# Patient Record
Sex: Male | Born: 1965 | Race: White | Hispanic: No | Marital: Married | Smoking: Never smoker
Health system: Southern US, Community
[De-identification: ages and names within clinical notes are randomized; demographics above are authoritative.]

## PROBLEM LIST (undated history)

## (undated) DIAGNOSIS — E785 Hyperlipidemia, unspecified: Secondary | ICD-10-CM

## (undated) DIAGNOSIS — R7303 Prediabetes: Secondary | ICD-10-CM

## (undated) DIAGNOSIS — K219 Gastro-esophageal reflux disease without esophagitis: Secondary | ICD-10-CM

## (undated) DIAGNOSIS — G43909 Migraine, unspecified, not intractable, without status migrainosus: Secondary | ICD-10-CM

## (undated) DIAGNOSIS — G4730 Sleep apnea, unspecified: Secondary | ICD-10-CM

## (undated) HISTORY — PX: WISDOM TOOTH EXTRACTION: SHX21

## (undated) HISTORY — DX: Prediabetes: R73.03

## (undated) HISTORY — PX: ELBOW SURGERY: SHX618

## (undated) HISTORY — PX: HEMORROIDECTOMY: SUR656

## (undated) HISTORY — DX: Migraine, unspecified, not intractable, without status migrainosus: G43.909

## (undated) HISTORY — DX: Gastro-esophageal reflux disease without esophagitis: K21.9

## (undated) HISTORY — DX: Hyperlipidemia, unspecified: E78.5

## (undated) HISTORY — PX: KNEE ARTHROSCOPY: SUR90

## (undated) HISTORY — PX: OTHER SURGICAL HISTORY: SHX169

## (undated) HISTORY — DX: Sleep apnea, unspecified: G47.30

## (undated) HISTORY — PX: VASECTOMY: SHX75

## (undated) HISTORY — PX: ADENOIDECTOMY: SUR15

## (undated) HISTORY — PX: MOUTH SURGERY: SHX715

## (undated) HISTORY — PX: ULNAR NERVE TRANSPOSITION: SHX2595

---

## 2005-09-26 ENCOUNTER — Ambulatory Visit: Payer: Self-pay | Admitting: Family Medicine

## 2005-09-27 ENCOUNTER — Ambulatory Visit: Payer: Self-pay | Admitting: Family Medicine

## 2005-10-03 ENCOUNTER — Ambulatory Visit: Payer: Self-pay | Admitting: Internal Medicine

## 2005-12-20 ENCOUNTER — Ambulatory Visit: Payer: Self-pay | Admitting: Internal Medicine

## 2006-01-31 ENCOUNTER — Ambulatory Visit: Payer: Self-pay | Admitting: Internal Medicine

## 2008-02-04 ENCOUNTER — Ambulatory Visit: Payer: Self-pay | Admitting: Internal Medicine

## 2008-02-04 LAB — CONVERTED CEMR LAB
ALT: 41 units/L (ref 0–53)
AST: 33 units/L (ref 0–37)
Albumin: 4.5 g/dL (ref 3.5–5.2)
Alkaline Phosphatase: 48 units/L (ref 39–117)
BUN: 18 mg/dL (ref 6–23)
Basophils Absolute: 0 10*3/uL (ref 0.0–0.1)
Basophils Relative: 0.3 % (ref 0.0–3.0)
Bilirubin Urine: NEGATIVE
Bilirubin, Direct: 0.1 mg/dL (ref 0.0–0.3)
Blood in Urine, dipstick: NEGATIVE
CO2: 32 meq/L (ref 19–32)
Calcium: 9.7 mg/dL (ref 8.4–10.5)
Chloride: 106 meq/L (ref 96–112)
Cholesterol: 231 mg/dL (ref 0–200)
Creatinine, Ser: 1.1 mg/dL (ref 0.4–1.5)
Direct LDL: 124.5 mg/dL
Eosinophils Absolute: 0.1 10*3/uL (ref 0.0–0.7)
Eosinophils Relative: 1.4 % (ref 0.0–5.0)
GFR calc Af Amer: 95 mL/min
GFR calc non Af Amer: 78 mL/min
Glucose, Bld: 102 mg/dL — ABNORMAL HIGH (ref 70–99)
Glucose, Urine, Semiquant: NEGATIVE
HCT: 44.2 % (ref 39.0–52.0)
HDL: 44.7 mg/dL (ref >39.0)
Hemoglobin: 15.3 g/dL (ref 13.0–17.0)
Ketones, urine, test strip: NEGATIVE
Lymphocytes Relative: 28.9 % (ref 12.0–46.0)
MCHC: 34.7 g/dL (ref 30.0–36.0)
MCV: 85 fL (ref 78.0–100.0)
Monocytes Absolute: 0.5 10*3/uL (ref 0.1–1.0)
Monocytes Relative: 10.9 % (ref 3.0–12.0)
Neutro Abs: 2.6 10*3/uL (ref 1.4–7.7)
Neutrophils Relative %: 58.5 % (ref 43.0–77.0)
Nitrite: NEGATIVE
Platelets: 188 10*3/uL (ref 150–400)
Potassium: 4.2 meq/L (ref 3.5–5.1)
Protein, U semiquant: NEGATIVE
RBC: 5.2 M/uL (ref 4.22–5.81)
RDW: 11.5 % (ref 11.5–14.6)
Sodium: 143 meq/L (ref 135–145)
Specific Gravity, Urine: 1.02
TSH: 1.1 microintl units/mL (ref 0.35–5.50)
Total Bilirubin: 1.1 mg/dL (ref 0.3–1.2)
Total CHOL/HDL Ratio: 5.2
Total Protein: 7.5 g/dL (ref 6.0–8.3)
Triglycerides: 206 mg/dL (ref 0–149)
Urobilinogen, UA: 0.2
VLDL: 41 mg/dL — ABNORMAL HIGH (ref 0–40)
WBC Urine, dipstick: NEGATIVE
WBC: 4.5 10*3/uL (ref 4.5–10.5)
pH: 7

## 2008-02-13 ENCOUNTER — Ambulatory Visit: Payer: Self-pay | Admitting: Internal Medicine

## 2008-02-13 DIAGNOSIS — M549 Dorsalgia, unspecified: Secondary | ICD-10-CM | POA: Insufficient documentation

## 2008-02-21 ENCOUNTER — Encounter: Payer: Self-pay | Admitting: Internal Medicine

## 2008-02-27 ENCOUNTER — Encounter: Payer: Self-pay | Admitting: Internal Medicine

## 2008-03-04 ENCOUNTER — Encounter: Payer: Self-pay | Admitting: Internal Medicine

## 2008-06-23 ENCOUNTER — Ambulatory Visit: Payer: Self-pay | Admitting: Family Medicine

## 2008-08-07 ENCOUNTER — Ambulatory Visit: Payer: Self-pay | Admitting: Internal Medicine

## 2008-08-07 DIAGNOSIS — E785 Hyperlipidemia, unspecified: Secondary | ICD-10-CM | POA: Insufficient documentation

## 2008-08-11 LAB — CONVERTED CEMR LAB
Cholesterol: 206 mg/dL — ABNORMAL HIGH (ref 0–200)
Direct LDL: 132.7 mg/dL
HDL: 39.8 mg/dL (ref >39.00)
Total CHOL/HDL Ratio: 5
Triglycerides: 193 mg/dL — ABNORMAL HIGH (ref 0.0–149.0)
VLDL: 38.6 mg/dL (ref 0.0–40.0)

## 2008-08-18 ENCOUNTER — Telehealth: Payer: Self-pay | Admitting: Internal Medicine

## 2008-09-10 ENCOUNTER — Telehealth: Payer: Self-pay | Admitting: Internal Medicine

## 2008-10-13 ENCOUNTER — Ambulatory Visit: Payer: Self-pay | Admitting: Internal Medicine

## 2008-10-15 ENCOUNTER — Ambulatory Visit: Payer: Self-pay | Admitting: Sports Medicine

## 2008-10-15 DIAGNOSIS — M546 Pain in thoracic spine: Secondary | ICD-10-CM | POA: Insufficient documentation

## 2008-10-15 DIAGNOSIS — M629 Disorder of muscle, unspecified: Secondary | ICD-10-CM | POA: Insufficient documentation

## 2008-10-18 ENCOUNTER — Encounter: Payer: Self-pay | Admitting: Sports Medicine

## 2008-11-25 ENCOUNTER — Ambulatory Visit: Payer: Self-pay | Admitting: Sports Medicine

## 2008-12-30 ENCOUNTER — Ambulatory Visit: Payer: Self-pay | Admitting: Sports Medicine

## 2009-01-19 ENCOUNTER — Ambulatory Visit: Payer: Self-pay | Admitting: Sports Medicine

## 2009-02-17 ENCOUNTER — Ambulatory Visit: Payer: Self-pay | Admitting: Sports Medicine

## 2009-05-04 ENCOUNTER — Ambulatory Visit: Payer: Self-pay | Admitting: Sports Medicine

## 2009-05-04 DIAGNOSIS — M25569 Pain in unspecified knee: Secondary | ICD-10-CM | POA: Insufficient documentation

## 2009-07-02 ENCOUNTER — Ambulatory Visit: Payer: Self-pay | Admitting: Sports Medicine

## 2009-07-02 DIAGNOSIS — M25519 Pain in unspecified shoulder: Secondary | ICD-10-CM | POA: Insufficient documentation

## 2009-07-13 ENCOUNTER — Ambulatory Visit (HOSPITAL_COMMUNITY): Admission: RE | Admit: 2009-07-13 | Discharge: 2009-07-13 | Payer: Self-pay | Admitting: Sports Medicine

## 2009-08-21 ENCOUNTER — Encounter: Payer: Self-pay | Admitting: Internal Medicine

## 2009-10-01 ENCOUNTER — Encounter: Payer: Self-pay | Admitting: Internal Medicine

## 2010-01-05 ENCOUNTER — Ambulatory Visit: Payer: Self-pay | Admitting: Internal Medicine

## 2010-01-05 DIAGNOSIS — J069 Acute upper respiratory infection, unspecified: Secondary | ICD-10-CM | POA: Insufficient documentation

## 2010-02-25 ENCOUNTER — Encounter: Payer: Self-pay | Admitting: Internal Medicine

## 2010-03-24 ENCOUNTER — Encounter: Payer: Self-pay | Admitting: Internal Medicine

## 2010-03-24 ENCOUNTER — Ambulatory Visit
Admission: RE | Admit: 2010-03-24 | Discharge: 2010-03-24 | Payer: Self-pay | Source: Home / Self Care | Attending: Internal Medicine | Admitting: Internal Medicine

## 2010-03-24 DIAGNOSIS — R1031 Right lower quadrant pain: Secondary | ICD-10-CM

## 2010-03-24 DIAGNOSIS — R109 Unspecified abdominal pain: Secondary | ICD-10-CM | POA: Insufficient documentation

## 2010-03-24 NOTE — Progress Notes (Signed)
Subjective:     Patient ID: James Ballard is a 45 y.o. male.  HPIPt presents to clinic as a work in for evaluation of possible hernia. States one month h/o right groin pain that does not radiate. Has not noted bulge or mass. Exacerbated by certain positions or movement. Has longstanding right leg pain being evaluated by orthopedics. Not currently taking any medication for the problem. No specific injury or trauma that precipitated sx's.  The following portions of the patient's history were reviewed and updated as appropriate: allergies, current medications, past medical history and problem list.  Review of Systems  Genitourinary: Negative for flank pain, penile swelling and penile pain.  Musculoskeletal: Positive for myalgias and gait problem. Negative for back pain, joint swelling and arthralgias.       Objective:   Physical Exam  Constitutional: He appears well-developed and well-nourished. No distress.  HENT:  Head: Normocephalic and atraumatic.  Nose: Nose normal.  Eyes: Pupils are equal, round, and reactive to light.  Abdominal: Soft. Bowel sounds are normal. He exhibits no distension and no mass. There is no tenderness. There is no rebound and no guarding.  Genitourinary: Penis normal.       No inguinal hernia(direct or indirect) noted on exam (supine as well as standing with valsalva)  Musculoskeletal:       Mild tenderness to palp right groin without mass or adenopathy  Skin: He is not diaphoretic.       Assessment:     Groin pain      Plan:   No obvious hernia noted on exam. Consider possible MSK etiology. Recommend f/u with orthopedics.

## 2010-04-06 NOTE — Assessment & Plan Note (Signed)
Summary: James Ballard - KNEE AND BACK PAIN/MJD   Vital Signs:  Patient profile:   45 year old male BP sitting:   120 / 82  Vitals Entered By: James Ballard CMA (July 02, 2009 2:22 PM)  History of Present Illness: James Ballard persists in sharp RT knee pain This is not all typical of ITB in that his RT knee gets pain w tennis strokes; at rest; with leg crossed; sometimes in bed. not giving out or swelling but pain is limiting.  Upper back pain This has recurred as well he denies stress but then admits that: Son recently diagnosed with Noonan's syndrome Facing major cardiac surgery Family are jehovah's witnesses Not sure who to use and worried about issues of bleeding as they do not use blood being eval at [facility]  feels more back pain w fatigue and w work  recently l;eft shoulder pain thisw occxurred after holding daughter at funeral [place] w overhead activity  Allergies: 1)  ! Doxycycline Hyclate (Doxycycline Hyclate)  Physical Exam  General:  Well-developed,well-nourished,in no acute distress; alert,appropriate and cooperative throughout examination Msk:  knee exam shows no effusion; stable ligaments; negative Mcmurray's and provocative meniscal tests; non painful patellar compression; patellar and quadriceps tendons unremarkable. Localized area along lat joint line near ITB is very tender This was area of meniscus bulging on [place]  Upper back not able to identify any abnormality to exam today area of tenderness if med tip and 2 cms of RT scapula  LT shoulder Inspection reveals no abnormalities or assymetry; no atrophy noted; palpation is unremarkable;  ROM is full in all planes but painful on overhead testing. specific strength testing of Rotator cuff mm reveals good strength throughoutbut pain on suprs spin testing + signs of impingement on empty can and hawkins  speeds and yergason's tests normal;  no labral pathology noted; norm scapular function observed.  negative painful  arc and no drop arm sign.     Impression & Recommendations:  Problem # 1:  KNEE PAIN, RIGHT (ICD-719.46) with persistence oif sxs I am concerned that we may be missing something  I think we need o go ahead with MRI to check lat joint and post lat corner  Problem # 2:  BACK PAIN, THORACIC REGION, RIGHT (ICD-724.1) I am concerned about stress as a trigger  son w serious illness  he continues to work through this but notes wife is very stressed as well  Problem # 3:  SHOULDER PAIN, LEFT (ICD-719.41) classic for Supraspin tendonitis will strat on theraband exercises  hopefully see some improvement in 6 weeks or so  will give trial of diazepam 2.5 to 5mg  at night maybe with better sleep and mM relaxation will have better healing  Complete Medication List: 1)  Multivitamins Tabs (Multiple vitamin) .... Once daily 2)  Amitriptyline Hcl 25 Mg Tabs (Amitriptyline hcl) .... One by mouth q hs 3)  Lidoderm 5 % Ptch (Lidocaine) .... Use for 12 hours daily 4)  Diazepam 5 Mg Tabs (Diazepam) .... Take one half tab qhs  Patient Instructions: 1)  YOUR MRI IS SCHEDULED FOR TUES, MAY 3RD, 2010 AT NOON AT [facility]. [phone] Prescriptions: DIAZEPAM 5 MG TABS (DIAZEPAM) TAKE ONE HALF TAB QHS  #30 x 0   Entered by:   James Ballard CMA   Authorized by:   James Baas MD   Signed by:   James Ballard CMA on 07/02/2009   Method used:   Print then Give to Patient   RxID:  [id number] DIAZEPAM 5 MG TABS (DIAZEPAM) TAKE ONE HALF TAB QHS  #30 x 0   Entered by:   James Ballard CMA   Authorized by:   James Baas MD   Signed by:   James Ballard CMA on 07/02/2009   Method used:   Print then Give to Patient   RxID:   [id number]   Appended Document: Ryelan Ballard - KNEE AND BACK PAIN/MJD

## 2010-04-06 NOTE — Progress Notes (Signed)
Summary: MRI questions  Phone Note Call from Patient   Caller: Patient Call For: Birdie Sons MD Summary of Call: Pt is calling asking if you have seen the MRI from Dr. Turner Daniels? [phone] Initial call taken by: Lynann Beaver CMA,  August 18, 2008 2:14 PM  Follow-up for Phone Call        no Follow-up by: Birdie Sons MD,  August 18, 2008 4:38 PM  Additional Follow-up for Phone Call Additional follow up Details #1::        notified pt. Additional Follow-up by: Lynann Beaver CMA,  August 18, 2008 4:44 PM

## 2010-04-06 NOTE — Assessment & Plan Note (Signed)
Summary: FU BACK/MJD   Vital Signs:  Patient profile:   45 year old male BP sitting:   126 / 85  Vitals Entered By: Lillia Pauls CMA (January 19, 2009 11:40 AM)  History of Present Illness: Patient seen last month for 2 problmes:  ITB - this was injected and has steadily improved and is at least 50% better doing basic exercises and stretches  RT pericapular pain this did not respond to injection has not responded to multiple tx to date since last seen was evaluated at [facility] they did not have other ideas and suggested tring lyrica this caused severe headaches and he stopped this after a few days  also tried accupuncture with no help tx over past year was not helpful and since referral to me we have tried a variety of TX options with no real relief could not tolerate NTG  One thing that has helped is exercises have built strength back around scapula even if he has not had a drop in pain  Allergies: 1)  ! Doxycycline Hyclate (Doxycycline Hyclate)  Physical Exam  General:  Well-developed,well-nourished,in no acute distress; alert,appropriate and cooperative throughout examination Msk:  upper back shows area of sensitivity along RT medial scapula there is no palpable defect no redness or swelling good mm develoopment Additional Exam:  MSK [place] this is repeated today to see if we can identify any changes on firest visit we saw some thickening of border of scapula consistent with partial avulsion of tendonous jxn today the same area is still thickened on dynamic imaging with rotation of upper trunk areas of calcification within the periscapular MM appear but no tears or gaps are noted vascularity is mildly increased if at all  images saved   Impression & Recommendations:  Problem # 1:  BACK PAIN, THORACIC REGION, RIGHT (ICD-724.1) keep up exercise regimen  we will try more possible therapies to see if we can find any success  topical lidoderm - use partial patch 12  hours daily for next month cont the exercise regimen as noted but see if pain level decreases with additon of lidoderm don't t stop normal activity as this has not helped  reck 1 month if no response consider dry needling with saline and low dose lidocaine to area of calcification  Problem # 2:  ITBS, 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 Once daily 2)  Amitriptyline Hcl 25 Mg Tabs (Amitriptyline hcl) .... One by mouth q hs 3)  Lidoderm 5 % Ptch (Lidocaine) .... Use for 12 hours daily Prescriptions: LIDODERM 5 % PTCH (LIDOCAINE) use for 12 hours daily  #30 x 3   Entered by:   Lillia Pauls CMA   Authorized by:   Enid Baas MD   Signed by:   Lillia Pauls CMA on 01/19/2009   Method used:   Electronically to        [facility] [address] [phone]* (retail)       [address]       [place], [place]  [address]       Ph: [phone] or [phone]       Fax: [phone]   RxID:   [id number]

## 2010-04-06 NOTE — Assessment & Plan Note (Signed)
Summary: F/U BACK/KNEE,MC   Vital Signs:  Patient profile:   45 year old male Height:      74 inches Weight:      192 pounds BMI:     24.74 BP sitting:   137 / 97  Vitals Entered By: Lillia Pauls CMA (May 04, 2009 11:07 AM)  History of Present Illness: James Ballard was able to hit some tennis balls upper back did not hurt with this did not hurt with swimming still doing rehab exercises  RT ITB this still feels painful with activity has done lots of hip abduction and stretches however on hitting tennis ball the RT knee sometimes almost feels unstable no locking no giving out no swelling  Allergies: 1)  ! Doxycycline Hyclate (Doxycycline Hyclate)  Physical Exam  General:  Well-developed,well-nourished,in no acute distress; alert,appropriate and cooperative throughout examination Msk:  RT knee exam shows no effusion; stable ligaments; negative Mcmurray's and provocative meniscal tests; non painful patellar compression; patellar and quadriceps tendons unremarkable.  ITB is not tender lat joint line slightly tender hip abductors now very strong Additional Exam:  MSK [place] RT knee Scan of RT knee is done and shows: nl patellar tendon nl quad tendon no swelling suprapatellar pouch no effusion  medial meniscus normal ITB looks normal lat meniscus is displaced slightly laterally at area of pain and prolapses sligltly from joint line no tear is seen though and normal doppler actiivity  images saved   Impression & Recommendations:  Problem # 1:  KNEE PAIN, RIGHT (ICD-719.46)  I thought this was primarily ITB now suspect knee capsule was strained and meniscus displaced from joint line but not torn this should heal with support  try don joy support keep up cycling but try recumbent and limit knee flex to 30 deg  Orders: [place] LIMITED ([id number])  reck in 6 wks  Problem # 2:  ITBS, 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 Once daily 2)  Amitriptyline Hcl 25 Mg Tabs (Amitriptyline hcl) .... One by mouth q hs 3)  Lidoderm 5 % Ptch (Lidocaine) .... Use for 12 hours daily

## 2010-04-06 NOTE — Assessment & Plan Note (Signed)
Summary: 6 month/njr   Vital Signs:  Patient profile:   45 year old male Weight:      188 pounds Temp:     98.2 degrees F oral Pulse rate:   70 / minute Pulse rhythm:   regular Resp:     16 per minute BP sitting:   106 / 62  Vitals Entered By: Lynann Beaver CMA (August 07, 2008 9:53 AM) CC: rov Is Patient Diabetic? No Pain Assessment Patient in pain? no        CC:  rov.  History of Present Illness: back pain--has seen chiropractor, PT, ORTHO, MRI no results. exercising a lot  Current Medications (verified): 1)  Multivitamins  Tabs (Multiple Vitamin) .... Once Daily  Allergies (verified): 1)  ! Doxycycline Hyclate (Doxycycline Hyclate)  Past History:  Past Medical History: Unremarkable Hyperlipidemia  Review of Systems       All other systems reviewed and were negative   Physical Exam  General:  alert and well-developed.   Head:  normocephalic and atraumatic.   Eyes:  pupils equal and pupils round.   Ears:  R ear normal, L ear normal, and no external deformities.   Neck:  No deformities, masses, or tenderness noted. Chest Wall:  No deformities, masses, tenderness or gynecomastia noted. Lungs:  Normal respiratory effort, chest expands symmetrically. Lungs are clear to auscultation, no crackles or wheezes. Heart:  Normal rate and regular rhythm. S1 and S2 normal without gallop, murmur, click, rub or other extra sounds. Abdomen:  Bowel sounds positive,abdomen soft and non-tender without masses, organomegaly or hernias noted. Msk:  FROM neck and shoulders no pain to palpation of back   Impression & Recommendations:  Problem # 1:  BACK PAIN (ICD-724.5) unclear etiology he has had multiple evaluations and treatments recommended to stop all get MRI results and dr rowan's note may need sports med referral The following medications were removed from the medication list:    Mobic 15 Mg Tabs (Meloxicam) .Marland Kitchen... Take 1 tablet by mouth once a day for back pain   Problem # 2:  HYPERLIPIDEMIA (ICD-272.4)  needs f/u Orders: Venipuncture (16109) TLB-Lipid Panel (80061-LIPID)  Labs Reviewed: SGOT: 33 (02/04/2008)   SGPT: 41 (02/04/2008)   HDL:44.7 (02/04/2008)  LDL:DEL (02/04/2008)  Chol:231 (02/04/2008)  Trig:206 (02/04/2008)  Complete Medication List: 1)  Multivitamins Tabs (Multiple vitamin) .... Once daily  Patient Instructions: 1)  results from Dr. Turner Daniels (MRI)

## 2010-04-06 NOTE — Progress Notes (Signed)
Summary: Patient requesting review of MRI  Phone Note Call from Patient   Summary of Call: Patient calling to request that you review the results of his MRI dated 03/04/2008 from [facility]. Patient states you were going to look at these and decide what specialist to send him to. Patient can be reached at [phone]. Initial call taken by: Darra Lis RMA,  September 10, 2008 11:10 AM  Follow-up for Phone Call        see if we have sent request. i have not received the information Follow-up by: Birdie Sons MD,  September 10, 2008 3:18 PM  Additional Follow-up for Phone Call Additional follow up Details #1::        I'm sorry I put the wrong date. MRI results is in the chart dated 02/27/2008. MRI Thoracic spine and MRI left hip. Additional Follow-up by: Darra Lis RMA,  September 10, 2008 3:46 PM

## 2010-04-06 NOTE — Assessment & Plan Note (Signed)
Summary: congestion//ccm   Vital Signs:  Patient profile:   45 year old male Weight:      198 pounds BMI:     26.22 Temp:     98.5 degrees F oral Pulse rate:   94 / minute BP sitting:   116 / 78  (left arm) Cuff size:   large  Vitals Entered By: Alfred Levins, CMA (June 23, 2008 3:19 PM) CC: fever 102 last night   History of Present Illness: One week of stuffy head, PND, ST, fever, and chest congestion. Coughing up green sputum.   Allergies: 1)  ! Doxycycline Hyclate (Doxycycline Hyclate)  Past History:  Past Medical History:    Reviewed history from 02/13/2008 and no changes required:    Unremarkable  Review of Systems  The patient denies anorexia, weight loss, weight gain, vision loss, decreased hearing, hoarseness, chest pain, syncope, dyspnea on exertion, peripheral edema, hemoptysis, abdominal pain, melena, hematochezia, severe indigestion/heartburn, hematuria, incontinence, genital sores, muscle weakness, suspicious skin lesions, transient blindness, difficulty walking, depression, unusual weight change, abnormal bleeding, enlarged lymph nodes, angioedema, breast masses, and testicular masses.    Physical Exam  General:  Well-developed,well-nourished,in no acute distress; alert,appropriate and cooperative throughout examination Head:  Normocephalic and atraumatic without obvious abnormalities. No apparent alopecia or balding. Eyes:  No corneal or conjunctival inflammation noted. EOMI. Perrla. Funduscopic exam benign, without hemorrhages, exudates or papilledema. Vision grossly normal. Ears:  External ear exam shows no significant lesions or deformities.  Otoscopic examination reveals clear canals, tympanic membranes are intact bilaterally without bulging, retraction, inflammation or discharge. Hearing is grossly normal bilaterally. Nose:  External nasal examination shows no deformity or inflammation. Nasal mucosa are pink and moist without lesions or exudates. Mouth:   Oral mucosa and oropharynx without lesions or exudates.  Teeth in good repair. Neck:  No deformities, masses, or tenderness noted. Lungs:  scattered rhonchi   Impression & Recommendations:  Problem # 1:  ACUTE BRONCHITIS (ICD-466.0)  His updated medication list for this problem includes:    Augmentin 875-125 Mg Tabs (Amoxicillin-pot clavulanate) .Marland Kitchen..Marland Kitchen Two times a day  Complete Medication List: 1)  Multivitamins Tabs (Multiple vitamin) .... Once daily 2)  Mobic 15 Mg Tabs (Meloxicam) .... Take 1 tablet by mouth once a day for back pain 3)  Augmentin 875-125 Mg Tabs (Amoxicillin-pot clavulanate) .... Two times a day  Patient Instructions: 1)  Please schedule a follow-up appointment as needed .  Prescriptions: AUGMENTIN 875-125 MG TABS (AMOXICILLIN-POT CLAVULANATE) two times a day  #20 x 0   Entered and Authorized by:   Nelwyn Salisbury MD   Signed by:   Nelwyn Salisbury MD on 06/23/2008   Method used:   Electronically to        [facility] [address] [phone]* (retail)       [address]       [place], [place]  [address]       Ph: [phone] or [phone]       Fax: [phone]   RxID:   [id number]

## 2010-04-06 NOTE — Assessment & Plan Note (Signed)
Summary: FU BACK PAIN/MJD   Vital Signs:  Patient profile:   45 year old male BP sitting:   120 / 80  Vitals Entered By: Lillia Pauls CMA (February 17, 2009 8:41 AM)  History of Present Illness: Has played tennis twice sore in upper back but not worse than ITB RT knee ITB was pretty sore after playing goes to weight room feels back is stronger and hurts less  lots of strength gain in shoulders and upper back that he can notice  not doing enough stretches or strength work for hip abductors but is doing some EXT ROT of hip and some abduction exercises  Allergies: 1)  ! Doxycycline Hyclate (Doxycycline Hyclate)  Physical Exam  General:  Well-developed,well-nourished,in no acute distress; alert,appropriate and cooperative throughout examination Msk:  on repeat abduction and elevation he gets mild spasm at tip and medial border of RT scapula excellent strength excellent ROM slightly sensitive to direct pressure  ITB on rt is not tender but he is weak on abduction   Impression & Recommendations:  Problem # 1:  BACK PAIN, THORACIC REGION, RIGHT (ICD-724.1) this has improved keep up periscapular rehab program go ahead and play tennis  progress overhead and serve slowly as described to him  reck in 2 to 3 mos  Problem # 2:  ITBS, 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 Once daily 2)  Amitriptyline Hcl 25 Mg Tabs (Amitriptyline hcl) .... One by mouth q hs 3)  Lidoderm 5 % Ptch (Lidocaine) .... Use for 12 hours daily

## 2010-04-06 NOTE — Letter (Signed)
Summary: [facility]  [facility]   Imported By: Maryln Gottron 09/10/2009 13:20:23  _____________________________________________________________________  External Attachment:    Type:   Image     Comment:   External Document

## 2010-04-06 NOTE — Assessment & Plan Note (Signed)
Summary: FU 1 MONTH APPT/MJD   Vital Signs:  Patient profile:   45 year old male Height:      74 inches Weight:      190 pounds BP sitting:   126 / 84  Vitals Entered By: Lillia Pauls CMA (December 30, 2008 8:50 AM)  History of Present Illness: Pt presents for follow of right medial inferior scapular pain and right IT band syndrome. He was able to use the nitroglycerin patches at first and experienced the headaches. He tolerated it at first but then missed a few days and was unable to restart the patches despite cutting them into eigths because of headaches. Overall, he used the patches for 2-3 weeks. He has been doing the scapular stabilizing exercises with 3lb weights and overall feels stronger. However, if he twists, he can cause the pain to flare significantly. While he feels somewhat stronger, his symptoms have not changed.   Still having difficulty with lateral motions to the right with his right IT band syndrome. Has been doing exercises. Will have a good week followed by a week of pain if he irritates it.    Allergies: 1)  ! Doxycycline Hyclate (Doxycycline Hyclate)  Physical Exam  General:  alert, well-developed, and well-nourished.   Head:  normocephalic and atraumatic.   Lungs:  normal respiratory effort, no intercostal retractions, no accessory muscle use, and normal breath sounds.  Assessed after injection.  Msk:  Back: No scoliosis, bruising or edema. + TTP along the medial inferior border of his right scapula No TTP along cervical, thoracic or lumbar spine Full ROM with forward flexion, extension, leaning side to side and rotation bilaterally Can walk on heels and toes 5/5 strength with resisted knee flexion and extension 5/5 strength with resisted hip flexion and abduction Normal sensation throughout  Knee: RIGHT Normal to inspection with no erythema or effusion or obvious bony abnormalities. Palpation normal with no warmth or joint line tenderness or patellar  tenderness or condyle tenderness. + TTP of distal insertion of the right IT band ROM normal in flexion and extension and lower leg rotation. Ligaments with solid consistent endpoints including ACL, PCL, LCL, MCL. Negative Mcmurray's and provocative meniscal tests. Non painful patellar compression. Patellar and quadriceps tendons unremarkable. Hamstring and quadriceps strength is normal.  Normal FABER test  Knee: LEFT Normal to inspection with no erythema or effusion or obvious bony abnormalities. Palpation normal with no warmth or joint line tenderness or patellar tenderness or condyle tenderness. ROM normal in flexion and extension and lower leg rotation. Ligaments with solid consistent endpoints including ACL, PCL, LCL, MCL. Negative Mcmurray's and provocative meniscal tests. Non painful patellar compression. Patellar and quadriceps tendons unremarkable. Hamstring and quadriceps strength is normal.  Normal FABER testing     Impression & Recommendations:  Problem # 1:  BACK PAIN, THORACIC REGION, RIGHT (ICD-724.1) Assessment Unchanged  Pt consented to an injection of his right inferior medial scapula.  Consent obtained and verified. Sterile betadine prep. Further cleansed with alcohol. Topical analgesic spray: Ethyl chloride. Injection Site: Right inferior medial scapula Approached in typical fashion Completed without difficulty Meds: Kenalog 10mg , marcaine and lidocaine Needle: 25 gauge Aftercare instructions and Red flags advised. Lungs were CTAB with normal breath sounds  No exercises or stretche for the next 3 days. 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  Consent obtained and verified. Sterile betadine prep. Furthur cleansed with alcohol. Topical analgesic spray: Ethyl chloride. Joint: Right IT band Approached in typical fashion: Injected below the IT band 3 cm proximal to the insertion site. Completed without difficulty Meds: Kenalog 10mg , lidocaine and marcaine Needle: 25 gauge Aftercare instructions and Red flags advised.  No exercises or stretche for the next 3 days. Told that this may or may not decrease pain fully but will hopefully make him more functional Continue current exercises to stretch and stengthen IT band  Orders: Kenalog 10 mg inj (J8841) Joint Aspirate / Injection, Large (20610)  Complete Medication List: 1)  Multivitamins Tabs (Multiple vitamin) .... Once daily 2)  Amitriptyline Hcl 25 Mg Tabs (Amitriptyline hcl) .... One by mouth q hs 3)  Nitroglycerin 0.2 Mg/hr Pt24 (Nitroglycerin) .... Cut patch into quarters. place onto affected area as directed. change daily.

## 2010-04-06 NOTE — Letter (Signed)
Summary: [facility]   Imported By: Maryln Gottron 12/15/2009 12:48:29  _____________________________________________________________________  External Attachment:    Type:   Image     Comment:   External Document

## 2010-04-06 NOTE — Assessment & Plan Note (Signed)
Summary: COUGH, CONGESTION // RS   Vital Signs:  Patient profile:   45 year old male Weight:      200 pounds Temp:     98.2 degrees F oral BP sitting:   114 / 84  (left arm) Cuff size:   large  Vitals Entered By: Alfred Levins, CMA (January 05, 2010 3:23 PM) CC: cough, congestion x4 days   CC:  cough and congestion x4 days.  History of Present Illness: 45 year old patient, who presents with 4 day history of head and chest congestion, and mildly productive cough.  There is been no fever, chills, chest pain or shortness of breath.  His mother is recovering from pneumonia.  No prior history of pneumonia.  Sputum production is described as mildly discolored.  He has been using OTC medications with marginal benefit  Allergies: 1)  ! Doxycycline Hyclate (Doxycycline Hyclate)  Past History:  Past Medical History: Reviewed history from 08/07/2008 and no changes required. Unremarkable Hyperlipidemia  Review of Systems       The patient complains of hoarseness and prolonged cough.  The patient denies anorexia, fever, weight loss, weight gain, vision loss, decreased hearing, chest pain, syncope, dyspnea on exertion, peripheral edema, headaches, hemoptysis, abdominal pain, melena, severe indigestion/heartburn, hematuria, incontinence, genital sores, muscle weakness, suspicious skin lesions, transient blindness, difficulty walking, depression, unusual weight change, abnormal bleeding, enlarged lymph nodes, angioedema, breast masses, and testicular masses.    Physical Exam  General:  Well-developed,well-nourished,in no acute distress; alert,appropriate and cooperative throughout examination Head:  Normocephalic and atraumatic without obvious abnormalities. No apparent alopecia or balding. Eyes:  No corneal or conjunctival inflammation noted. EOMI. Perrla. Funduscopic exam benign, without hemorrhages, exudates or papilledema. Vision grossly normal. Ears:  External ear exam shows no  significant lesions or deformities.  Otoscopic examination reveals clear canals, tympanic membranes are intact bilaterally without bulging, retraction, inflammation or discharge. Hearing is grossly normal bilaterally. Nose:  External nasal examination shows no deformity or inflammation. Nasal mucosa are pink and moist without lesions or exudates. Mouth:  Oral mucosa and oropharynx without lesions or exudates.  Teeth in good repair. Neck:  No deformities, masses, or tenderness noted. Lungs:  Normal respiratory effort, chest expands symmetrically. Lungs are clear to auscultation, no crackles or wheezes. Heart:  Normal rate and regular rhythm. S1 and S2 normal without gallop, murmur, click, rub or other extra sounds.   Impression & Recommendations:  Problem # 1:  URI (ICD-465.9)  His updated medication list for this problem includes:    Hydrocodone-homatropine 5-1.5 Mg/52ml Syrp (Hydrocodone-homatropine) .Marland Kitchen... 1 teaspoon every 6 hours as needed for cough  Complete Medication List: 1)  Multivitamins Tabs (Multiple vitamin) .... Once daily 2)  Amitriptyline Hcl 25 Mg Tabs (Amitriptyline hcl) .... One by mouth q hs 3)  Lidoderm 5 % Ptch (Lidocaine) .... Use for 12 hours daily 4)  Diazepam 5 Mg Tabs (Diazepam) .... Take one half tab qhs 5)  Hydrocodone-homatropine 5-1.5 Mg/34ml Syrp (Hydrocodone-homatropine) .Marland Kitchen.. 1 teaspoon every 6 hours as needed for cough  Patient Instructions: 1)  Please schedule a follow-up appointment as needed. 2)  Get plenty of rest, drink lots of clear liquids, and use Tylenol or Ibuprofen for fever and comfort. Return in 7-10 days if you're not better:sooner if you're feeling worse. Prescriptions: HYDROCODONE-HOMATROPINE 5-1.5 MG/5ML SYRP (HYDROCODONE-HOMATROPINE) 1 teaspoon every 6 hours as needed for cough  #6 oz x 0   Entered and Authorized by:   Gordy Savers  MD   Signed by:  Gordy Savers  MD on 01/05/2010   Method used:   Print then Give to Patient    RxID:   [id number]    Orders Added: 1)  Est. Patient Level III [14782]

## 2010-04-06 NOTE — Letter (Signed)
Summary: [facility]   [facility]   Imported By: Maryln Gottron 08/20/2008 11:12:00  _____________________________________________________________________  External Attachment:    Type:   Image     Comment:   External Document

## 2010-04-06 NOTE — Assessment & Plan Note (Signed)
Summary: fu back/jw   Vital Signs:  Patient profile:   45 year old male BP sitting:   110 / 80  Vitals Entered By: Lillia Pauls CMA (November 25, 2008 11:38 AM)  History of Present Illness: Patient is a 45 yo male who presents for follow-up of right-sided back pain located near his inferolateral scapula as well as for right IT band syndrome.  He originally hurt his shoulder while doing a barbell pushup over a year ago. He still has difficulty with fatigue while trying to hold his 1 yo child. He has been doing the home scapular stabilizing exercise program daily. He initially started with 5lb weights but had to decrease to 3lb weights secondary to developing more left shoulder fatigue. He has not noticed much of a change in his symptoms. He has been able to start some push-up and is not doing push-ups on a chair. He tried the amitriptyline but had to decrease the dose to 1/4 of a pill or to about 6mg  at bedtime.   His right IT band started after taking a lateral step to the right to hit a forehand in tennis. At that time he felt a sharp pain in his lateral right knee. Denies swelling, locking or popping. Doing intermittent home exercises and stretches but not as faithfully as scapular exercises.   Allergies: 1)  ! Doxycycline Hyclate (Doxycycline Hyclate)  Physical Exam  General:  alert, well-developed, well-nourished, and well-hydrated.   Head:  Normocephalic and atraumatic. Msk:  Right Shoulder: Symmetric with left shoulder. No bony abnormalities, edema or bruising.  + TTP at inferomedial aspect of his scapula with point tenderness. No other tenderness noted. Has full ROM with forward flexion and abduction with mild pain. 5/5 strength Negative empty can, Neer's, Hawkin's and Speed's. Minimal pain with cross-over. Normal resisted internal ane external rotation.  Left Shoulder: No edema, bruising or bony abnormalities. Full ROM with forward flexion and abduction. No TTP  throughout. 5/5 strength Negative empty can, Neer's, Cross-over, Hawkin's and Speed's testing. Normal resisted internal and external rotation.  Knee: RIGHT Normal to inspection with no erythema or effusion or obvious bony abnormalities. Palpation normal with no warmth or joint line tenderness or patellar tenderness or condyle tenderness. + TTP at insertion of IT band Tight IT band appreciated ROM normal in flexion and extension and lower leg rotation. Ligaments with solid consistent endpoints including ACL, PCL, LCL, MCL. Negative Mcmurray's and provocative meniscal tests. Non painful patellar compression. Patellar and quadriceps tendons unremarkable. Hamstring and quadriceps strength is normal.   Knee: LEFT Normal to inspection with no erythema or effusion or obvious bony abnormalities. Palpation normal with no warmth or joint line tenderness or patellar tenderness or condyle tenderness. ROM normal in flexion and extension and lower leg rotation. Ligaments with solid consistent endpoints including ACL, PCL, LCL, MCL. Negative Mcmurray's and provocative meniscal tests. Non painful patellar compression. Patellar and quadriceps tendons unremarkable. Hamstring and quadriceps strength is normal.   Hip abductor strength is decreased bilaterally. Weak hip flexors as well.    Impression & Recommendations:  Problem # 1:  BACK PAIN, THORACIC REGION, RIGHT (ICD-724.1) Assessment Unchanged 1. Continue home exercise program but decrease weights down to 1lb if needed to avoid left shoulder fatigue. 2. Given an Rx for nitroglycerin patch, 0.2mg , to wear daily. Explained that we have little evidence to support this but that this may increase blood flow to the area stimulating healing. Warned of possible headaches for 3-5 days as a side effect. Directed  to cut patches in quarters and to place a new quarter patch to his back in the painful region daily.  3. Retun in 4 weeks for  follow-up.  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 Given IT band stretches and exercises to do daily. 2. Ice massage daily for 5 minutes. 3. OTC NSAID as needed. 4. Activity modification.   Complete Medication List: 1)  Multivitamins Tabs (Multiple vitamin) .... Once daily 2)  Amitriptyline Hcl 25 Mg Tabs (Amitriptyline hcl) .... One by mouth q hs 3)  Nitroglycerin 0.2 Mg/hr Pt24 (Nitroglycerin) .... Cut patch into quarters. place onto affected area as directed. change daily. Prescriptions: NITROGLYCERIN 0.2 MG/HR PT24 (NITROGLYCERIN) Cut patch into quarters. Place onto affected area as directed. Change daily.  #1 box x 2   Entered by:   Jannifer Rodney MD   Authorized by:   Enid Baas MD   Signed by:   Jannifer Rodney MD on 11/25/2008   Method used:   Electronically to        [facility] [address] [phone]* (retail)       [address]       [place], [place]  [address]       Ph: [phone] or [phone]       Fax: [phone]   RxID:   [id number]

## 2010-04-08 NOTE — Assessment & Plan Note (Signed)
Summary: ? HERNIA//CCM   Vital Signs:  Patient profile:   45 year old male Weight:      198 pounds Pulse rate:   70 / minute BP sitting:   120 / 80  (left arm)  Vitals Entered By: Kyung Rudd, CMA (March 24, 2010 10:50 AM) CC: pt c/o possible hernia x 1 1/2 months   CC:  pt c/o possible hernia x 1 1/2 months.  History of Present Illness: Patient presents to clinic as a workin for evaluation of groin pain. Notes one month h/o right groin pain without radiation, injury or trauma. Currently under care of orthopedic chiropractor but wondered if could be hernia. Denies any palpable mass or bulge. Pain increases with activity or position change. Taking no medication for this.   Current Medications (verified): 1)  None  Allergies (verified): 1)  ! Doxycycline Hyclate (Doxycycline Hyclate)  Past History:  Past Medical History: Last updated: 08/07/2008 Unremarkable Hyperlipidemia  Past Surgical History: Last updated: 02/13/2008 ulnar nerve tranpiration left knee arthroscopy incisor cyst excision sesmoid fx-excised PMH-FH-SH reviewed-no changes except otherwise noted  Review of Systems      See HPI GI:  Denies abdominal pain. MS:  Complains of joint pain, mid back pain, and muscle aches; denies joint redness, joint swelling, low back pain, and stiffness.  Physical Exam  General:  Well-developed,well-nourished,in no acute distress; alert,appropriate and cooperative throughout examination Head:  Normocephalic and atraumatic without obvious abnormalities. No apparent alopecia or balding. Eyes:  pupils equal, pupils round, and corneas and lenses clear.   Ears:  no external deformities.   Nose:  no external deformity.   Abdomen:  Bowel sounds positive,abdomen soft and non-tender without masses, organomegaly or hernias noted. Genitalia:  No obvious direct or indirect inguinal hernia noted. Examined supine, standing with and without valsalva maneuvers. Neurologic:  alert &  oriented X3 and gait normal.     Impression & Recommendations:  Problem # 1:  INGUINAL PAIN, RIGHT (ICD-789.09) Assessment New No obvious hernia noted. Recommend f/u with orthopedics for further evaluation.   Orders Added: 1)  Est. Patient Level II [30865]

## 2010-04-08 NOTE — Miscellaneous (Signed)
Summary: Immunization Entry   Immunization History:  Influenza Immunization History:    Influenza:  historical (02/23/2010)

## 2010-12-09 ENCOUNTER — Other Ambulatory Visit (INDEPENDENT_AMBULATORY_CARE_PROVIDER_SITE_OTHER): Payer: BC Managed Care – PPO

## 2010-12-09 DIAGNOSIS — Z0000 Encounter for general adult medical examination without abnormal findings: Secondary | ICD-10-CM

## 2010-12-09 LAB — CBC WITH DIFFERENTIAL/PLATELET
Basophils Absolute: 0 10*3/uL (ref 0.0–0.1)
Basophils Relative: 0.4 % (ref 0.0–3.0)
Eosinophils Absolute: 0.1 10*3/uL (ref 0.0–0.7)
Eosinophils Relative: 1.3 % (ref 0.0–5.0)
HCT: 44.8 % (ref 39.0–52.0)
Hemoglobin: 15.1 g/dL (ref 13.0–17.0)
Lymphocytes Relative: 28.5 % (ref 12.0–46.0)
Lymphs Abs: 1.4 10*3/uL (ref 0.7–4.0)
MCHC: 33.7 g/dL (ref 30.0–36.0)
MCV: 85.5 fl (ref 78.0–100.0)
Monocytes Absolute: 0.5 10*3/uL (ref 0.1–1.0)
Monocytes Relative: 9.8 % (ref 3.0–12.0)
Neutro Abs: 3 10*3/uL (ref 1.4–7.7)
Neutrophils Relative %: 60 % (ref 43.0–77.0)
Platelets: 226 10*3/uL (ref 150.0–400.0)
RBC: 5.24 Mil/uL (ref 4.22–5.81)
RDW: 12.5 % (ref 11.5–14.6)
WBC: 4.9 10*3/uL (ref 4.5–10.5)

## 2010-12-09 LAB — POCT URINALYSIS DIPSTICK
Bilirubin, UA: NEGATIVE
Blood, UA: NEGATIVE
Glucose, UA: NEGATIVE
Ketones, UA: NEGATIVE
Leukocytes, UA: NEGATIVE
Nitrite, UA: NEGATIVE
Protein, UA: NEGATIVE
Spec Grav, UA: 1.015
Urobilinogen, UA: 0.2
pH, UA: 6

## 2010-12-09 LAB — BASIC METABOLIC PANEL
BUN: 15 mg/dL (ref 6–23)
CO2: 27 mEq/L (ref 19–32)
Calcium: 9.3 mg/dL (ref 8.4–10.5)
Chloride: 106 mEq/L (ref 96–112)
Creatinine, Ser: 1 mg/dL (ref 0.4–1.5)
GFR: 88.23 mL/min (ref >60.00)
Glucose, Bld: 89 mg/dL (ref 70–99)
Potassium: 4.1 mEq/L (ref 3.5–5.1)
Sodium: 140 mEq/L (ref 135–145)

## 2010-12-09 LAB — HEPATIC FUNCTION PANEL
ALT: 27 U/L (ref 0–53)
AST: 25 U/L (ref 0–37)
Albumin: 4.6 g/dL (ref 3.5–5.2)
Alkaline Phosphatase: 54 U/L (ref 39–117)
Bilirubin, Direct: 0.1 mg/dL (ref 0.0–0.3)
Total Bilirubin: 1 mg/dL (ref 0.3–1.2)
Total Protein: 7.4 g/dL (ref 6.0–8.3)

## 2010-12-09 LAB — LIPID PANEL
Cholesterol: 236 mg/dL — ABNORMAL HIGH (ref 0–200)
HDL: 49.4 mg/dL (ref >39.00)
Total CHOL/HDL Ratio: 5
Triglycerides: 179 mg/dL — ABNORMAL HIGH (ref 0.0–149.0)
VLDL: 35.8 mg/dL (ref 0.0–40.0)

## 2010-12-09 LAB — LDL CHOLESTEROL, DIRECT: Direct LDL: 153.1 mg/dL

## 2010-12-09 LAB — TSH: TSH: 1.35 u[IU]/mL (ref 0.35–5.50)

## 2010-12-15 ENCOUNTER — Encounter: Payer: Self-pay | Admitting: Internal Medicine

## 2010-12-16 ENCOUNTER — Ambulatory Visit (INDEPENDENT_AMBULATORY_CARE_PROVIDER_SITE_OTHER): Payer: BC Managed Care – PPO | Admitting: Internal Medicine

## 2010-12-16 ENCOUNTER — Encounter: Payer: Self-pay | Admitting: Internal Medicine

## 2010-12-16 VITALS — BP 130/90 | HR 68 | Temp 98.3°F | Ht 73.5 in | Wt 198.0 lb

## 2010-12-16 DIAGNOSIS — Z0000 Encounter for general adult medical examination without abnormal findings: Secondary | ICD-10-CM

## 2010-12-16 DIAGNOSIS — Z23 Encounter for immunization: Secondary | ICD-10-CM

## 2010-12-16 NOTE — Progress Notes (Signed)
  Subjective:    Patient ID: James Ballard, male    DOB: Jun 08, 1965, 45 y.o.   MRN: [id number]  HPI  cpx  Continues to have multiple aches and pains, but he is back to playing tennis.   Past Medical History  Diagnosis Date  . Hyperlipidemia    Past Surgical History  Procedure Date  . Ulnar nerve transposition   . Knee arthroscopy     left  . Sesmoid fx- excised     reports that he has never smoked. He does not have any smokeless tobacco history on file. His alcohol and drug histories not on file. family history includes Heart disease in his father; Hyperlipidemia in his brother; and Migraines in his brother and sister. Allergies  Allergen Reactions  . Doxycycline Hyclate     REACTION: rash     Review of Systems  patient denies chest pain, shortness of breath, orthopnea. Denies lower extremity edema, abdominal pain, change in appetite, change in bowel movements. Patient denies rashes, musculoskeletal complaints. No other specific complaints in a complete review of systems.      Objective:   Physical Exam Well-developed male in no acute distress. HEENT exam atraumatic, normocephalic, extraocular muscles are intact. Conjunctivae are pink without exudate. Neck is supple without lymphadenopathy, thyromegaly, jugular venous distention. Chest is clear to auscultation without increased work of breathing. Cardiac exam S1-S2 are regular. The PMI is normal. No significant murmurs or gallops. Abdominal exam active bowel sounds, soft, nontender. No abdominal bruits. Extremities no clubbing cyanosis or edema. Peripheral pulses are normal without bruits. Neurologic exam alert and oriented without any motor or sensory deficits.     Assessment & Plan:  Well visit---health maint UTD

## 2011-04-20 ENCOUNTER — Encounter: Payer: Self-pay | Admitting: Family

## 2011-04-20 ENCOUNTER — Ambulatory Visit (INDEPENDENT_AMBULATORY_CARE_PROVIDER_SITE_OTHER): Payer: BC Managed Care – PPO | Admitting: Family

## 2011-04-20 DIAGNOSIS — J069 Acute upper respiratory infection, unspecified: Secondary | ICD-10-CM

## 2011-04-20 DIAGNOSIS — R059 Cough, unspecified: Secondary | ICD-10-CM

## 2011-04-20 DIAGNOSIS — R05 Cough: Secondary | ICD-10-CM

## 2011-04-20 MED ORDER — AMOXICILLIN-POT CLAVULANATE 875-125 MG PO TABS: 1.0000 | ORAL_TABLET | Freq: Two times a day (BID) | ORAL | Status: AC

## 2011-04-20 NOTE — Progress Notes (Signed)
  Subjective:    Patient ID: James Ballard, male    DOB: 10-04-1965, 46 y.o.   MRN: [id number]  HPI 46 year old white male, nonsmoker, patient of Dr. Cato Mulligan is in today with complaints of fever, nasal congestion, chest congestion and Tylenol for 5 days and worsening. He is now developed pain in his feet since he. Cough is productive of green sputum. Hasn't taken over-the-counter DayQuil and myoglobin He denies any lightheadedness, dizziness, chest pain, palpitations, shortness of breath or edema.   Review of Systems  Constitutional: Positive for fever and fatigue.  HENT: Positive for congestion, sneezing, postnasal drip and sinus pressure.   Eyes: Negative.   Respiratory: Positive for cough.   Cardiovascular: Negative.   Gastrointestinal: Negative.   Musculoskeletal: Negative.   Skin: Negative.   Neurological: Negative.   Hematological: Negative.   Psychiatric/Behavioral: Negative.    Past Medical History  Diagnosis Date  . Hyperlipidemia     History   Social History  . Marital Status: Married    Spouse Name: N/A    Number of Children: N/A  . Years of Education: N/A   Occupational History  . Not on file.   Social History Main Topics  . Smoking status: Never Smoker   . Smokeless tobacco: Not on file  . Alcohol Use:   . Drug Use:   . Sexually Active:    Other Topics Concern  . Not on file   Social History Narrative  . No narrative on file    Past Surgical History  Procedure Date  . Ulnar nerve transposition   . Knee arthroscopy     left  . Sesmoid fx- excised     Family History  Problem Relation Age of Onset  . Heart disease Father     pacemaker--has been removed-may have never needed it  . Migraines Sister   . Hyperlipidemia Brother   . Migraines Brother     Allergies  Allergen Reactions  . Doxycycline Hyclate     REACTION: rash    No current outpatient prescriptions on file prior to visit.    BP 120/94  Temp(Src) 98.6 F (37 C) (Oral)  Wt  200 lb (90.719 kg)chart   Objective:   Physical Exam  Constitutional: He is oriented to person, place, and time.  Cardiovascular: Normal rate and regular rhythm.   Pulmonary/Chest: Effort normal and breath sounds normal.  Musculoskeletal: Normal range of motion.  Neurological: He is alert and oriented to person, place, and time.  Skin: Skin is warm and dry.  Psychiatric: He has a normal mood and affect.           Assessment & Plan:  Assessment: Upper respiratory infection likely bacterial. Cough  Plan: Augmentin 875 one by mouth twice a day x10 days. Mucinex DM over-the-counter as directed. Rest. Drink plenty of fluids. Call the office if symptoms worsen or persist, recheck as scheduled and when necessary.

## 2011-04-20 NOTE — Patient Instructions (Signed)
1. Mucinex DM as directed.   Upper Respiratory Infection, Adult An upper respiratory infection (URI) is also sometimes known as the common cold. The upper respiratory tract includes the nose, sinuses, throat, trachea, and bronchi. Bronchi are the airways leading to the lungs. Most people improve within 1 week, but symptoms can last up to 2 weeks. A residual cough may last even longer.  CAUSES Many different viruses can infect the tissues lining the upper respiratory tract. The tissues become irritated and inflamed and often become very moist. Mucus production is also common. A cold is contagious. You can easily spread the virus to others by oral contact. This includes kissing, sharing a glass, coughing, or sneezing. Touching your mouth or nose and then touching a surface, which is then touched by another person, can also spread the virus. SYMPTOMS  Symptoms typically develop 1 to 3 days after you come in contact with a cold virus. Symptoms vary from person to person. They may include:  Runny nose.   Sneezing.   Nasal congestion.   Sinus irritation.   Sore throat.   Loss of voice (laryngitis).   Cough.   Fatigue.   Muscle aches.   Loss of appetite.   Headache.   Low-grade fever.  DIAGNOSIS  You might diagnose your own cold based on familiar symptoms, since most people get a cold 2 to 3 times a year. Your caregiver can confirm this based on your exam. Most importantly, your caregiver can check that your symptoms are not due to another disease such as strep throat, sinusitis, pneumonia, asthma, or epiglottitis. Blood tests, throat tests, and X-rays are not necessary to diagnose a common cold, but they may sometimes be helpful in excluding other more serious diseases. Your caregiver will decide if any further tests are required. RISKS AND COMPLICATIONS  You may be at risk for a more severe case of the common cold if you smoke cigarettes, have chronic heart disease (such as heart  failure) or lung disease (such as asthma), or if you have a weakened immune system. The very young and very old are also at risk for more serious infections. Bacterial sinusitis, middle ear infections, and bacterial pneumonia can complicate the common cold. The common cold can worsen asthma and chronic obstructive pulmonary disease (COPD). Sometimes, these complications can require emergency medical care and may be life-threatening. PREVENTION  The best way to protect against getting a cold is to practice good hygiene. Avoid oral or hand contact with people with cold symptoms. Wash your hands often if contact occurs. There is no clear evidence that vitamin C, vitamin E, echinacea, or exercise reduces the chance of developing a cold. However, it is always recommended to get plenty of rest and practice good nutrition. TREATMENT  Treatment is directed at relieving symptoms. There is no cure. Antibiotics are not effective, because the infection is caused by a virus, not by bacteria. Treatment may include:  Increased fluid intake. Sports drinks offer valuable electrolytes, sugars, and fluids.   Breathing heated mist or steam (vaporizer or shower).   Eating chicken soup or other clear broths, and maintaining good nutrition.   Getting plenty of rest.   Using gargles or lozenges for comfort.   Controlling fevers with ibuprofen or acetaminophen as directed by your caregiver.   Increasing usage of your inhaler if you have asthma.  Zinc gel and zinc lozenges, taken in the first 24 hours of the common cold, can shorten the duration and lessen the severity of symptoms.  Pain medicines may help with fever, muscle aches, and throat pain. A variety of non-prescription medicines are available to treat congestion and runny nose. Your caregiver can make recommendations and may suggest nasal or lung inhalers for other symptoms.  HOME CARE INSTRUCTIONS   Only take over-the-counter or prescription medicines for pain,  discomfort, or fever as directed by your caregiver.   Use a warm mist humidifier or inhale steam from a shower to increase air moisture. This may keep secretions moist and make it easier to breathe.   Drink enough water and fluids to keep your urine clear or pale yellow.   Rest as needed.   Return to work when your temperature has returned to normal or as your caregiver advises. You may need to stay home longer to avoid infecting others. You can also use a face mask and careful hand washing to prevent spread of the virus.  SEEK MEDICAL CARE IF:   After the first few days, you feel you are getting worse rather than better.   You need your caregiver's advice about medicines to control symptoms.   You develop chills, worsening shortness of breath, or brown or red sputum. These may be signs of pneumonia.   You develop yellow or brown nasal discharge or pain in the face, especially when you bend forward. These may be signs of sinusitis.   You develop a fever, swollen neck glands, pain with swallowing, or white areas in the back of your throat. These may be signs of strep throat.  SEEK IMMEDIATE MEDICAL CARE IF:   You have a fever.   You develop severe or persistent headache, ear pain, sinus pain, or chest pain.   You develop wheezing, a prolonged cough, cough up blood, or have a change in your usual mucus (if you have chronic lung disease).   You develop sore muscles or a stiff neck.  Document Released: 08/17/2000 Document Revised: 11/03/2010 Document Reviewed: 06/25/2010 [facility] Patient Information 2012 [place], [place].

## 2011-04-25 ENCOUNTER — Ambulatory Visit (INDEPENDENT_AMBULATORY_CARE_PROVIDER_SITE_OTHER): Payer: BC Managed Care – PPO | Admitting: Family

## 2011-04-25 ENCOUNTER — Encounter: Payer: Self-pay | Admitting: Family

## 2011-04-25 ENCOUNTER — Other Ambulatory Visit: Payer: Self-pay | Admitting: Family

## 2011-04-25 ENCOUNTER — Ambulatory Visit (INDEPENDENT_AMBULATORY_CARE_PROVIDER_SITE_OTHER)
Admission: RE | Admit: 2011-04-25 | Discharge: 2011-04-25 | Disposition: A | Discharge status: Home / Self Care | Payer: BC Managed Care – PPO | Source: Ambulatory Visit | Attending: Family | Admitting: Family

## 2011-04-25 DIAGNOSIS — R059 Cough, unspecified: Secondary | ICD-10-CM

## 2011-04-25 DIAGNOSIS — R05 Cough: Secondary | ICD-10-CM

## 2011-04-25 DIAGNOSIS — R062 Wheezing: Secondary | ICD-10-CM

## 2011-04-25 MED ORDER — PREDNISONE 20 MG PO TABS: ORAL_TABLET | ORAL | Status: AC

## 2011-04-25 MED ORDER — MOXIFLOXACIN HCL 400 MG PO TABS: 400.0000 mg | ORAL_TABLET | Freq: Every day | ORAL | Status: AC

## 2011-04-25 NOTE — Patient Instructions (Signed)

## 2011-04-25 NOTE — Progress Notes (Signed)
  Subjective:    Patient ID: James Ballard, male    DOB: December 18, 1965, 46 y.o.   MRN: [id number]  HPI Comments: C/o productive cough with bloody-brown secretions, sorethroat, sinus pressure, and intermittent headaches getting progressively worse since Wed. Was seen in clinic Wed and prescribed amoxicillin, has taken 1/2 the tablets and OTC mucinex. Denies dyspnea, fever, chills, nausea, or diarrhea.   Cough Associated symptoms include postnasal drip, a sore throat and wheezing. Pertinent negatives include no ear pain, rhinorrhea or shortness of breath.  URI  Associated symptoms include congestion, coughing, a sore throat and wheezing. Pertinent negatives include no ear pain, rhinorrhea or sneezing.      Review of Systems  Constitutional: Negative.   HENT: Positive for congestion, sore throat, postnasal drip and sinus pressure. Negative for ear pain, nosebleeds, facial swelling, rhinorrhea, sneezing and ear discharge.   Eyes: Negative.   Respiratory: Positive for cough and wheezing. Negative for apnea, chest tightness, shortness of breath and stridor.    Past Medical History  Diagnosis Date  . Hyperlipidemia     History   Social History  . Marital Status: Married    Spouse Name: N/A    Number of Children: N/A  . Years of Education: N/A   Occupational History  . Not on file.   Social History Main Topics  . Smoking status: Never Smoker   . Smokeless tobacco: Not on file  . Alcohol Use:   . Drug Use:   . Sexually Active:    Other Topics Concern  . Not on file   Social History Narrative  . No narrative on file    Past Surgical History  Procedure Date  . Ulnar nerve transposition   . Knee arthroscopy     left  . Sesmoid fx- excised     Family History  Problem Relation Age of Onset  . Heart disease Father     pacemaker--has been removed-may have never needed it  . Migraines Sister   . Hyperlipidemia Brother   . Migraines Brother     Allergies  Allergen  Reactions  . Doxycycline Hyclate     REACTION: rash    Current Outpatient Prescriptions on File Prior to Visit  Medication Sig Dispense Refill  . amoxicillin-clavulanate (AUGMENTIN) 875-125 MG per tablet Take 1 tablet by mouth 2 (two) times daily.  20 tablet  0    BP 120/80  Pulse 86  Temp(Src) 98.3 F (36.8 C) (Oral)  SpO2 97%chart     Objective:   Physical Exam  Constitutional: He is oriented to person, place, and time. He appears well-developed and well-nourished. No distress.  HENT:  Right Ear: External ear normal.  Left Ear: External ear normal.  Nose: Nose normal.  Mouth/Throat: Oropharynx is clear and moist.  Cardiovascular: Normal rate, regular rhythm and normal heart sounds.  Exam reveals no gallop and no friction rub.   No murmur heard. Pulmonary/Chest: Effort normal. No respiratory distress. He has wheezes. He has no rales. He exhibits no tenderness.  Neurological: He is alert and oriented to person, place, and time.  Skin: Skin is warm and dry. He is not diaphoretic.          Assessment & Plan:  Assessment: URI  Plan: Complete amoxicillin, albuterol inhaler, prednisone. Chest xray. Teaching handouts provided on URI and opportunity for questions provided. Encouraged to cough and deep breathe at home and increase po fluids. RTC if s/s get worse or do not subside

## 2011-05-02 ENCOUNTER — Encounter: Payer: Self-pay | Admitting: Family

## 2011-05-02 ENCOUNTER — Ambulatory Visit (INDEPENDENT_AMBULATORY_CARE_PROVIDER_SITE_OTHER): Payer: BC Managed Care – PPO | Admitting: Family

## 2011-05-02 ENCOUNTER — Ambulatory Visit (INDEPENDENT_AMBULATORY_CARE_PROVIDER_SITE_OTHER): Payer: BC Managed Care – PPO | Admitting: Internal Medicine

## 2011-05-02 ENCOUNTER — Ambulatory Visit (INDEPENDENT_AMBULATORY_CARE_PROVIDER_SITE_OTHER)
Admission: RE | Admit: 2011-05-02 | Discharge: 2011-05-02 | Disposition: A | Discharge status: Home / Self Care | Payer: BC Managed Care – PPO | Source: Ambulatory Visit | Attending: Internal Medicine | Admitting: Internal Medicine

## 2011-05-02 ENCOUNTER — Telehealth: Payer: Self-pay | Admitting: *Deleted

## 2011-05-02 ENCOUNTER — Encounter: Payer: Self-pay | Admitting: Internal Medicine

## 2011-05-02 VITALS — BP 126/82 | HR 109 | Temp 98.1°F | Ht 73.0 in | Wt 203.4 lb

## 2011-05-02 DIAGNOSIS — I2699 Other pulmonary embolism without acute cor pulmonale: Secondary | ICD-10-CM

## 2011-05-02 DIAGNOSIS — R059 Cough, unspecified: Secondary | ICD-10-CM

## 2011-05-02 DIAGNOSIS — J189 Pneumonia, unspecified organism: Secondary | ICD-10-CM

## 2011-05-02 DIAGNOSIS — R05 Cough: Secondary | ICD-10-CM

## 2011-05-02 DIAGNOSIS — M549 Dorsalgia, unspecified: Secondary | ICD-10-CM | POA: Insufficient documentation

## 2011-05-02 MED ORDER — IOHEXOL 300 MG/ML  SOLN
80.0000 mL | Freq: Once | INTRAMUSCULAR | Status: AC | PRN
  Administered 2011-05-02: 80 mL via INTRAVENOUS

## 2011-05-02 MED ORDER — HYDROCOD POLST-CHLORPHEN POLST 10-8 MG/5ML PO LQCR: 5.0000 mL | Freq: Two times a day (BID) | ORAL | Status: DC

## 2011-05-02 MED ORDER — BENZONATATE 100 MG PO CAPS: 100.0000 mg | ORAL_CAPSULE | Freq: Three times a day (TID) | ORAL | Status: AC | PRN

## 2011-05-02 NOTE — Patient Instructions (Addendum)
Please have CT scan chest with contrast PE protocol for cxr abnormality, cough, and recent knee surgery  - will call you with results Please start netti pot daily - use with bottled water Please start OTC zegerid 1 tablet daily in morning on empty stomach Do not drink or eat coffee, spices, colas, cheeses, spirits, wine, fried foods to help with GERD Take tussionex 5mL twice daily x 3 days only; no refills AFter you finish 3 days of tussionex, start OTC chlorpheniramine 4mg , x 2 tablets at night; do not drive after this or do work. If it makes you too dry/sleepy, cut            down to 1 tablet or stop Take tessalon cough perles 200mg  three times daily  If all these medicines are producing side effects, or confusion or sleepiness or you have cost concerns call us immediately FOllowup based on CT report

## 2011-05-02 NOTE — Telephone Encounter (Signed)
Pt called re: results of CT. Cell#. James Ballard

## 2011-05-02 NOTE — Telephone Encounter (Signed)
pls tell patient that CT negative for PE. The pneumonia is very very small and just small area of collapse from infection that should clear up. COugh is from irritated airways as a result of recent infectin, sinus and acid reflux. Needs to follow all advise I told him. End of week if cough is still as bad needs another prednisone; he has to call back Friday about this.   Also, new advise:  suggest he not talk or whisper for 3 days in a row (he can write) and if there is urge to cough drink water or sip lozenge  FU 3 weeks to ssee progress. Come sooner if worse

## 2011-05-02 NOTE — Telephone Encounter (Signed)
Pt aware of results and OV set for 05-23-11. Carron Curie, CMA

## 2011-05-02 NOTE — Progress Notes (Signed)
  Subjective:    Patient ID: James Ballard, male    DOB: [birth year], 46 y.o.   MRN: [id number]  HPI 46 year old white male, nonsmoker, a patient of Dr. Cato Mulligan is in today with persistent pneumonia. He was originally seen on 04/20/2011 diagnosed with bronchitis started on amoxicillin. He returned on 04/25/2011 chest x-ray was done, showed pneumonia, antibiotic was changed to Avelox once a day x10 days. He's also taking prednisone over a nine-day taper. Patient is back today with persistent pneumonia, cough, back pain and overall just worsening. He feels more weak and fatigued today. Denies any fever.   Review of Systems  Constitutional: Negative.   HENT: Negative.   Eyes: Negative.   Respiratory: Positive for cough and shortness of breath. Negative for wheezing.   Cardiovascular: Negative.   Musculoskeletal: Negative.   Skin: Negative.   Neurological: Negative.   Hematological: Negative.   Psychiatric/Behavioral: Negative.    Past Medical History  Diagnosis Date  . Hyperlipidemia     History   Social History  . Marital Status: Married    Spouse Name: N/A    Number of Children: N/A  . Years of Education: N/A   Occupational History  . Not on file.   Social History Main Topics  . Smoking status: Never Smoker   . Smokeless tobacco: Not on file  . Alcohol Use:   . Drug Use:   . Sexually Active:    Other Topics Concern  . Not on file   Social History Narrative  . No narrative on file    Past Surgical History  Procedure Date  . Ulnar nerve transposition   . Knee arthroscopy     left  . Sesmoid fx- excised     Family History  Problem Relation Age of Onset  . Heart disease Father     pacemaker--has been removed-may have never needed it  . Migraines Sister   . Hyperlipidemia Brother   . Migraines Brother     Allergies  Allergen Reactions  . Doxycycline Hyclate     REACTION: rash    Current Outpatient Prescriptions on File Prior to Visit  Medication Sig  Dispense Refill  . moxifloxacin (AVELOX) 400 MG tablet Take 1 tablet (400 mg total) by mouth daily.  10 tablet  0  . predniSONE (DELTASONE) 20 MG tablet 60mg  PO qam x 3 days, 40mg  po qam x 3 days, 20mg  qam x 3 days  18 tablet  0    BP 120/80  Temp(Src) 98.7 F (37.1 C) (Oral)  Wt 200 lb (90.719 kg)chart    Objective:   Physical Exam  Constitutional: He is oriented to person, place, and time. He appears well-developed and well-nourished.  HENT:  Right Ear: External ear normal.  Left Ear: External ear normal.  Nose: Nose normal.  Mouth/Throat: Oropharynx is clear and moist.  Neck: Normal range of motion. Neck supple.  Cardiovascular: Normal rate, regular rhythm and normal heart sounds.   Pulmonary/Chest: Effort normal and breath sounds normal.  Musculoskeletal: Normal range of motion.  Neurological: He is alert and oriented to person, place, and time.  Skin: Skin is warm and dry.  Psychiatric: He has a normal mood and affect.          Assessment & Plan:  Assessment: Pneumonia-uncontrolled, cough  Plan: Consult with pulmonology. They will see patient in 20 minutes. Further workup of his condition will be performed by pulmonology. Recheck here when necessary.

## 2011-05-02 NOTE — Progress Notes (Signed)
Subjective:    Patient ID: James Ballard, male    DOB: Feb 21, 1966, 46 y.o.   MRN: [id number]  HPI  PMD is Dr Cato Mulligan  IOV 05/02/2011 46 year old male.  reports that he has never smoked. He does not have any smokeless tobacco history on file.  Had rt knee arthroscopy 2 months ago without complications. Then  well tll 04/14/11 or so and then develop acute URI , sinus congestion and cold symptoms with cough. Some symptoms resolved but cough did not. Saw OMD around 04/20/11 and was given antibiotics but no relief. Then saw PMD again  04/25/11 and given avelox (due to end 05/03/11) and prednisone ending today/tomorrow. CXR at that time reportedly showed LLL PNA per hx (official report below ) But cough no better. Rates cough as severe, constant, hacking, horrible. Unable to work past 2 weeks. No sick contacts. Had flu shot. Does have some sinus drainage and GERD. Denies wheeze, hemoptysis, dsypnea but chest does feel tight. This past few days has acute back pain at Lumbosacral region - focal, spinous area, brrought on only when he coughs and relieved when he does not cough. No associated tenderness or bladder bowel incontinence.      Dg Chest 2 View  04/25/2011  *RADIOLOGY REPORT*  Clinical Data: History of cough, wheezing and congestion. Nonsmoker.  CHEST - 2 VIEW  Comparison: None.  Findings: In the lateral aspect of the lingula there is a pleural based air space opacity.  Lungs are otherwise clear.  No pleural effusions.  Pulmonary vasculature is within normal limits.  Heart size and mediastinal contours are unremarkable.  IMPRESSION: 1.  Pleural-based wedge-shaped opacity in the lateral aspect of the lingula.  Given the patient's history, this is most concerning for infectious consolidation.  However, this appearance can be seen in the setting of pulmonary infarction from pulmonary embolism ( i.e., a "Hampton's hump").  Clinical correlation is recommended.  If there is any clinical concern for pulmonary  embolism, further evaluation with PE protocol CT scan may be warranted.  These results will be called to the ordering clinician or representative by the Radiologist Assistant, and communication documented in the PACS Dashboard.  Original Report Authenticated By: Florencia Reasons, M.D.     Past Medical History  Diagnosis Date  . Hyperlipidemia      Family History  Problem Relation Age of Onset  . Heart disease Father     pacemaker--has been removed-may have never needed it  . Migraines Sister   . Hyperlipidemia Brother   . Migraines Brother      History   Social History  . Marital Status: Married    Spouse Name: N/A    Number of Children: N/A  . Years of Education: N/A   Occupational History  . Not on file.   Social History Main Topics  . Smoking status: Never Smoker   . Smokeless tobacco: Not on file  . Alcohol Use:   . Drug Use:   . Sexually Active:    Other Topics Concern  . Not on file   Social History Narrative  . No narrative on file     Allergies  Allergen Reactions  . Doxycycline Hyclate     REACTION: rash     Outpatient Prescriptions Prior to Visit  Medication Sig Dispense Refill  . moxifloxacin (AVELOX) 400 MG tablet Take 1 tablet (400 mg total) by mouth daily.  10 tablet  0  . predniSONE (DELTASONE) 20 MG tablet 60mg  PO  qam x 3 days, 40mg  po qam x 3 days, 20mg  qam x 3 days  18 tablet  0      Review of Systems  Constitutional: Negative for fever and unexpected weight change.  HENT: Negative for ear pain, nosebleeds, congestion, sore throat, rhinorrhea, sneezing, trouble swallowing, dental problem, postnasal drip and sinus pressure.   Eyes: Negative for redness and itching.  Respiratory: Positive for cough and shortness of breath. Negative for chest tightness and wheezing.   Cardiovascular: Negative for palpitations and leg swelling.  Gastrointestinal: Negative for nausea and vomiting.  Genitourinary: Negative for dysuria.  Musculoskeletal:  Negative for joint swelling.  Skin: Negative for rash.  Neurological: Negative for headaches.  Hematological: Does not bruise/bleed easily.  Psychiatric/Behavioral: Negative for dysphoric mood. The patient is not nervous/anxious.        Objective:   Physical Exam  Nursing note and vitals reviewed. Constitutional: He is oriented to person, place, and time. He appears well-developed and well-nourished. No distress.  HENT:  Head: Normocephalic and atraumatic.  Right Ear: External ear normal.  Left Ear: External ear normal.  Mouth/Throat: Oropharynx is clear and moist. No oropharyngeal exudate.  Eyes: Conjunctivae and EOM are normal. Pupils are equal, round, and reactive to light. Right eye exhibits no discharge. Left eye exhibits no discharge. No scleral icterus.  Neck: Normal range of motion. Neck supple. No JVD present. No tracheal deviation present. No thyromegaly present.  Cardiovascular: Normal rate, regular rhythm and intact distal pulses.  Exam reveals no gallop and no friction rub.   No murmur heard. Pulmonary/Chest: Effort normal and breath sounds normal. No respiratory distress. He has no wheezes. He has no rales. He exhibits no tenderness.       ? Diminished left mid zone  Abdominal: Soft. Bowel sounds are normal. He exhibits no distension and no mass. There is no tenderness. There is no rebound and no guarding.  Genitourinary:       Very anxious  Musculoskeletal: Normal range of motion. He exhibits no edema and no tenderness.  Lymphadenopathy:    He has no cervical adenopathy.  Neurological: He is alert and oriented to person, place, and time. He has normal reflexes. No cranial nerve deficit. Coordination normal.  Skin: Skin is warm and dry. No rash noted. He is not diaphoretic. No erythema. No pallor.  Psychiatric: He has a normal mood and affect. His behavior is normal. Judgment and thought content normal.          Assessment & Plan:

## 2011-05-02 NOTE — Assessment & Plan Note (Signed)
I think cough is related tp post pna reactive cough made worse by sinus and gerd. However, if he has collapse of lung that could make it worse. CXR ddx is PE and he did have knee procedure 2 weeks ago. Doubt PE but still useful to rule out it out due to knee surgery hx and cxr finding. BRonch depending on cxr results. Otherwise, contrl sinus, gerd and symptoms with tussionex ->benznotate. FU depending on CT results

## 2011-05-02 NOTE — Assessment & Plan Note (Signed)
Suspect is muscle spasm. ADvised NSAID, warm compress. But need to keep an eye on it.

## 2011-05-02 NOTE — Telephone Encounter (Signed)
Received call from [facility] at [employer].  She reports that CT was negative for PE.

## 2011-05-06 ENCOUNTER — Encounter: Payer: Self-pay | Admitting: Pulmonary Disease

## 2011-05-06 ENCOUNTER — Ambulatory Visit (INDEPENDENT_AMBULATORY_CARE_PROVIDER_SITE_OTHER)
Admission: RE | Admit: 2011-05-06 | Discharge: 2011-05-06 | Disposition: A | Discharge status: Home / Self Care | Payer: BC Managed Care – PPO | Source: Ambulatory Visit | Attending: Pulmonary Disease | Admitting: Pulmonary Disease

## 2011-05-06 ENCOUNTER — Telehealth: Payer: Self-pay | Admitting: Internal Medicine

## 2011-05-06 ENCOUNTER — Ambulatory Visit (INDEPENDENT_AMBULATORY_CARE_PROVIDER_SITE_OTHER): Payer: BC Managed Care – PPO | Admitting: Pulmonary Disease

## 2011-05-06 VITALS — BP 116/86 | HR 74 | Temp 98.2°F | Ht 74.0 in | Wt 201.8 lb

## 2011-05-06 DIAGNOSIS — R05 Cough: Secondary | ICD-10-CM

## 2011-05-06 DIAGNOSIS — R059 Cough, unspecified: Secondary | ICD-10-CM

## 2011-05-06 NOTE — Progress Notes (Signed)
  Subjective:    Patient ID: James Ballard, male    DOB: 10/16/1965, 46 y.o.   MRN: [id number]  HPI PMD is Dr Cato Mulligan  IOV 05/02/2011  46 year old male. never smoker. Had rt knee arthroscopy 2 months ago without complications. Then well tll 04/14/11 or so and then develop acute URI , sinus congestion and cold symptoms with cough. Some symptoms resolved but cough did not. Saw OMD around 04/20/11 and was given antibiotics but no relief. Then saw PMD again 04/25/11 and given avelox (due to end 05/03/11) and prednisone ending today/tomorrow. CXR  showed LLL PNA  But cough no better. Rates cough as severe, constant, hacking, horrible.  Does have some sinus drainage and GERD.   05/06/2011 finished avelox & prednisone tussionex was too strong for him - cough has subsided but developed pain along anterior chest wall with coughing only - not with deep breathing. CT angio neg for PE but showed     Review of Systems     Objective:   Physical Exam        Assessment & Plan:

## 2011-05-06 NOTE — Telephone Encounter (Signed)
Pt is requesting to be seen today for pain in his left side/lung. Pt denies any increased sob and said his cough is actually better but not gone. Pt will see Dr. Vassie Loll this afternoon at 3:45pm as acute sick work-in for "lung pain". Pt will seek emergency help if sxs get worse before appt today.

## 2011-05-06 NOTE — Patient Instructions (Signed)
Chest xray today Your pain seems to be musculoskeletal related to cough Take DELSYM 2 tsp thrice daily as needed for cough

## 2011-05-06 NOTE — Assessment & Plan Note (Signed)
Your pain seems to be musculoskeletal related to cough - expect to resolve in a week or two Take DELSYM 2 tsp thrice daily as needed for cough CXR shows clearing of pneumonia CT angio neg for pE is reassuring.

## 2011-05-23 ENCOUNTER — Ambulatory Visit: Payer: BC Managed Care – PPO | Admitting: Internal Medicine

## 2012-01-31 ENCOUNTER — Telehealth: Payer: Self-pay | Admitting: Internal Medicine

## 2012-01-31 NOTE — Telephone Encounter (Signed)
Ok with me 

## 2012-01-31 NOTE — Telephone Encounter (Signed)
Pt needs cpx before end of December. Can I sch with NP?

## 2012-02-01 NOTE — Telephone Encounter (Signed)
ok 

## 2012-02-01 NOTE — Telephone Encounter (Signed)
Pt has been sch

## 2012-02-07 ENCOUNTER — Other Ambulatory Visit (INDEPENDENT_AMBULATORY_CARE_PROVIDER_SITE_OTHER): Payer: BC Managed Care – PPO

## 2012-02-07 DIAGNOSIS — Z0000 Encounter for general adult medical examination without abnormal findings: Secondary | ICD-10-CM

## 2012-02-07 LAB — BASIC METABOLIC PANEL
BUN: 15 mg/dL (ref 6–23)
CO2: 29 mEq/L (ref 19–32)
Calcium: 9.3 mg/dL (ref 8.4–10.5)
Chloride: 100 mEq/L (ref 96–112)
Creatinine, Ser: 1.1 mg/dL (ref 0.4–1.5)
GFR: 74.46 mL/min (ref >60.00)
Glucose, Bld: 103 mg/dL — ABNORMAL HIGH (ref 70–99)
Potassium: 3.9 mEq/L (ref 3.5–5.1)
Sodium: 137 mEq/L (ref 135–145)

## 2012-02-07 LAB — CBC WITH DIFFERENTIAL/PLATELET
Basophils Absolute: 0 10*3/uL (ref 0.0–0.1)
Basophils Relative: 0.4 % (ref 0.0–3.0)
Eosinophils Absolute: 0 10*3/uL (ref 0.0–0.7)
Eosinophils Relative: 0.8 % (ref 0.0–5.0)
HCT: 43.7 % (ref 39.0–52.0)
Hemoglobin: 14.8 g/dL (ref 13.0–17.0)
Lymphocytes Relative: 30.5 % (ref 12.0–46.0)
Lymphs Abs: 1.4 10*3/uL (ref 0.7–4.0)
MCHC: 33.8 g/dL (ref 30.0–36.0)
MCV: 83.5 fl (ref 78.0–100.0)
Monocytes Absolute: 0.5 10*3/uL (ref 0.1–1.0)
Monocytes Relative: 11 % (ref 3.0–12.0)
Neutro Abs: 2.7 10*3/uL (ref 1.4–7.7)
Neutrophils Relative %: 57.3 % (ref 43.0–77.0)
Platelets: 233 10*3/uL (ref 150.0–400.0)
RBC: 5.24 Mil/uL (ref 4.22–5.81)
RDW: 12.1 % (ref 11.5–14.6)
WBC: 4.8 10*3/uL (ref 4.5–10.5)

## 2012-02-07 LAB — HEPATIC FUNCTION PANEL
ALT: 28 U/L (ref 0–53)
AST: 27 U/L (ref 0–37)
Albumin: 4.7 g/dL (ref 3.5–5.2)
Alkaline Phosphatase: 54 U/L (ref 39–117)
Bilirubin, Direct: 0.1 mg/dL (ref 0.0–0.3)
Total Bilirubin: 1.1 mg/dL (ref 0.3–1.2)
Total Protein: 7.8 g/dL (ref 6.0–8.3)

## 2012-02-07 LAB — POCT URINALYSIS DIPSTICK
Bilirubin, UA: NEGATIVE
Blood, UA: NEGATIVE
Glucose, UA: NEGATIVE
Ketones, UA: NEGATIVE
Leukocytes, UA: NEGATIVE
Nitrite, UA: NEGATIVE
Protein, UA: NEGATIVE
Spec Grav, UA: 1.025
Urobilinogen, UA: 0.2
pH, UA: 5.5

## 2012-02-07 LAB — LIPID PANEL
Cholesterol: 231 mg/dL — ABNORMAL HIGH (ref 0–200)
HDL: 41.1 mg/dL (ref >39.00)
Total CHOL/HDL Ratio: 6
Triglycerides: 165 mg/dL — ABNORMAL HIGH (ref 0.0–149.0)
VLDL: 33 mg/dL (ref 0.0–40.0)

## 2012-02-07 LAB — LDL CHOLESTEROL, DIRECT: Direct LDL: 158.3 mg/dL

## 2012-02-07 LAB — TSH: TSH: 1.19 u[IU]/mL (ref 0.35–5.50)

## 2012-02-09 ENCOUNTER — Ambulatory Visit: Payer: BC Managed Care – PPO

## 2012-02-09 DIAGNOSIS — Z131 Encounter for screening for diabetes mellitus: Secondary | ICD-10-CM

## 2012-02-10 LAB — HEMOGLOBIN A1C: Hgb A1c MFr Bld: 5.7 % (ref 4.6–6.5)

## 2012-02-13 ENCOUNTER — Encounter: Payer: Self-pay | Admitting: Family

## 2012-02-13 ENCOUNTER — Ambulatory Visit (INDEPENDENT_AMBULATORY_CARE_PROVIDER_SITE_OTHER): Payer: BC Managed Care – PPO | Admitting: Family

## 2012-02-13 VITALS — BP 120/80 | HR 79 | Temp 98.6°F | Ht 73.0 in | Wt 195.0 lb

## 2012-02-13 DIAGNOSIS — R7309 Other abnormal glucose: Secondary | ICD-10-CM

## 2012-02-13 DIAGNOSIS — Z23 Encounter for immunization: Secondary | ICD-10-CM

## 2012-02-13 DIAGNOSIS — R195 Other fecal abnormalities: Secondary | ICD-10-CM

## 2012-02-13 DIAGNOSIS — R739 Hyperglycemia, unspecified: Secondary | ICD-10-CM

## 2012-02-13 DIAGNOSIS — Z0000 Encounter for general adult medical examination without abnormal findings: Secondary | ICD-10-CM

## 2012-02-13 LAB — HEMOGLOBIN A1C: Hgb A1c MFr Bld: 5.8 % (ref 4.6–6.5)

## 2012-02-13 MED ORDER — SUMATRIPTAN 20 MG/ACT NA SOLN: 1.0000 | NASAL | Status: DC | PRN

## 2012-02-13 NOTE — Progress Notes (Signed)
Subjective:    Patient ID: James Ballard, male    DOB: [birth year], 46 y.o.   MRN: [id number]  HPI  Patient presents for yearly preventative medicine examination. All immunizations and health maintenance protocols were reviewed with the patient and they are up to date with these protocols. Screening laboratory values were reviewed with the patient including screening of hyperlipidemia PSA renal function and hepatic function. There medications past medical history social history problem list and allergies were reviewed in detail. Goals were established with regard to weight loss exercise diet in compliance with medications  Patient has complaints of a headache that affected the right side of his head, right eye extended into his teeth and right neck occurs about once a week. The headaches typically last about 12 hours with sensitivity to light and noise. He's been taking Allegra-D and a tension headache medication is unsure if it's really helping his symptoms. He has some nausea but denies any vomiting. Reports an increase in stress at work due to the bands skin recent changes.  Patient has blood in his stool about 50% of the time. Reports having numerous procedures done to treat internal hemorrhoids. Last procedure was last year. Has never had a colonoscopy.    Review of Systems  Eyes: Negative.   Respiratory: Negative.   Cardiovascular: Negative.   Gastrointestinal: Positive for blood in stool and anal bleeding. Negative for nausea, abdominal pain, diarrhea and abdominal distention.  Genitourinary: Negative.   Musculoskeletal: Negative.   Skin: Negative.   Neurological: Positive for headaches. Negative for dizziness and numbness.  Hematological: Negative.   Psychiatric/Behavioral: Negative.    Past Medical History  Diagnosis Date  . Hyperlipidemia     History   Social History  . Marital Status: Married    Spouse Name: N/A    Number of Children: N/A  . Years of Education: N/A    Occupational History  . Not on file.   Social History Main Topics  . Smoking status: Never Smoker   . Smokeless tobacco: Not on file  . Alcohol Use:   . Drug Use:   . Sexually Active:    Other Topics Concern  . Not on file   Social History Narrative  . No narrative on file    Past Surgical History  Procedure Date  . Ulnar nerve transposition   . Knee arthroscopy     left  . Sesmoid fx- excised     Family History  Problem Relation Age of Onset  . Heart disease Father     pacemaker--has been removed-may have never needed it  . Migraines Sister   . Hyperlipidemia Brother   . Migraines Brother     Allergies  Allergen Reactions  . Doxycycline Hyclate     REACTION: rash    Current Outpatient Prescriptions on File Prior to Visit  Medication Sig Dispense Refill  . benzonatate (TESSALON PERLES) 100 MG capsule Take 1 capsule (100 mg total) by mouth 3 (three) times daily as needed for cough.  90 capsule  0    BP 120/80  Pulse 79  Temp 98.6 F (37 C) (Oral)  Ht 6\' 1"  (1.854 m)  Wt 195 lb (88.451 kg)  BMI 25.73 kg/m2  SpO2 98%chart    Objective:   Physical Exam  Constitutional: He is oriented to person, place, and time. He appears well-developed and well-nourished.  HENT:  Head: Normocephalic and atraumatic.  Right Ear: External ear normal.  Left Ear: External ear normal.  Nose: Nose  normal.  Mouth/Throat: Oropharynx is clear and moist.  Eyes: Conjunctivae normal and EOM are normal. Pupils are equal, round, and reactive to light. Right eye exhibits no discharge.  Neck: Normal range of motion. Neck supple. No thyromegaly present.  Cardiovascular: Normal rate, regular rhythm and normal heart sounds.  Exam reveals no gallop and no friction rub.   No murmur heard. Pulmonary/Chest: Effort normal and breath sounds normal. He has no wheezes.  Abdominal: Soft. Bowel sounds are normal. He exhibits no distension. There is no tenderness. There is no rebound and no  guarding.  Genitourinary: Prostate normal. Guaiac positive stool. No penile tenderness.  Musculoskeletal: Normal range of motion. He exhibits edema. He exhibits no tenderness.  Neurological: He is alert and oriented to person, place, and time. He has normal reflexes. He displays normal reflexes. No cranial nerve deficit. He exhibits normal muscle tone. Coordination normal.  Skin: Skin is warm and dry.  Psychiatric: He has a normal mood and affect.          Assessment & Plan:  Assessment: CPX, Headache-Migraine, Heme Positive Stool, Hemorrhoids  Plan: Refer to GI for colonoscopy screening. Start Imitrex nasal spray for headaches. We'll consider preventative medication if headaches continue to occur. Encouraged healthy diet and exercise. Recheck cholesterol in 3 months LDL was 158. He will try lifestyle modifications over the next several months.

## 2013-07-24 ENCOUNTER — Ambulatory Visit (INDEPENDENT_AMBULATORY_CARE_PROVIDER_SITE_OTHER): Payer: BC Managed Care – PPO | Admitting: Physician Assistant

## 2013-07-24 ENCOUNTER — Encounter: Payer: Self-pay | Admitting: Physician Assistant

## 2013-07-24 VITALS — BP 120/82 | HR 80 | Temp 98.3°F | Resp 20 | Wt 201.0 lb

## 2013-07-24 DIAGNOSIS — J019 Acute sinusitis, unspecified: Secondary | ICD-10-CM

## 2013-07-24 MED ORDER — AMOXICILLIN-POT CLAVULANATE 875-125 MG PO TABS: 1.0000 | ORAL_TABLET | Freq: Two times a day (BID) | ORAL | Status: DC

## 2013-07-24 MED ORDER — BENZONATATE 100 MG PO CAPS: 100.0000 mg | ORAL_CAPSULE | Freq: Two times a day (BID) | ORAL | Status: DC | PRN

## 2013-07-24 NOTE — Progress Notes (Signed)
Pre visit review using our clinic review tool, if applicable. No additional management support is needed unless otherwise documented below in the visit note. 

## 2013-07-24 NOTE — Patient Instructions (Addendum)
Augmentin twice a day for 10 days for sinus infection.  Tessalon Perles twice a day as needed for cough.  Continue Tylenol Cold to help you sleep.  Continue Mucinex to help break up thick secretions.  Drink plenty of water to stay hydrated.  Follow up in 1 week to reassess, or sooner if symptoms worsen or fail to improve.   Sinusitis Sinusitis is redness, soreness, and puffiness (inflammation) of the air pockets in the bones of your face (sinuses). The redness, soreness, and puffiness can cause air and mucus to get trapped in your sinuses. This can allow germs to grow and cause an infection.  HOME CARE   Drink enough fluids to keep your pee (urine) clear or pale yellow.  Use a humidifier in your home.  Run a hot shower to create steam in the bathroom. Sit in the bathroom with the door closed. Breathe in the steam 3 4 times a day.  Put a warm, moist washcloth on your face 3 4 times a day, or as told by your doctor.  Use salt water sprays (saline sprays) to wet the thick fluid in your nose. This can help the sinuses drain.  Only take medicine as told by your doctor. GET HELP RIGHT AWAY IF:   Your pain gets worse.  You have very bad headaches.  You are sick to your stomach (nauseous).  You throw up (vomit).  You are very sleepy (drowsy) all the time.  Your face is puffy (swollen).  Your vision changes.  You have a stiff neck.  You have trouble breathing. MAKE SURE YOU:   Understand these instructions.  Will watch your condition.  Will get help right away if you are not doing well or get worse. Document Released: 08/10/2007 Document Revised: 11/16/2011 Document Reviewed: 09/27/2011 [facility] Patient Information 2014 [place].

## 2013-07-24 NOTE — Progress Notes (Signed)
Subjective:    Patient ID: James Ballard, male    DOB: Jun 15, 1965, 48 y.o.   MRN: [id number]  Cough This is a new problem. The current episode started in the past 7 days. The problem has been gradually worsening. The problem occurs every few minutes. The cough is productive of sputum. Associated symptoms include ear congestion, ear pain, headaches, nasal congestion and postnasal drip. Pertinent negatives include no chest pain, chills, fever, heartburn, hemoptysis, myalgias, rash, rhinorrhea, sore throat, shortness of breath, sweats, weight loss or wheezing. Nothing aggravates the symptoms. Treatments tried: tylenol cold and mucinex. The treatment provided mild relief. There is no history of asthma, COPD or environmental allergies.     Review of Systems  Constitutional: Negative for fever, chills and weight loss.  HENT: Positive for ear pain, postnasal drip and sinus pressure (Maxillary toothache on right.). Negative for rhinorrhea and sore throat.   Respiratory: Positive for cough. Negative for hemoptysis, shortness of breath and wheezing.   Cardiovascular: Negative for chest pain.  Gastrointestinal: Negative for heartburn, nausea, vomiting and diarrhea.  Musculoskeletal: Negative for myalgias.  Skin: Negative for rash.  Allergic/Immunologic: Negative for environmental allergies.  Neurological: Positive for headaches.  All other systems reviewed and are negative.  Past Medical History  Diagnosis Date  . Hyperlipidemia    Past Surgical History  Procedure Laterality Date  . Ulnar nerve transposition    . Knee arthroscopy      left  . Sesmoid fx- excised      reports that he has never smoked. He does not have any smokeless tobacco history on file. His alcohol and drug histories are not on file. family history includes Heart disease in his father; Hyperlipidemia in his brother; Migraines in his brother and sister. Allergies  Allergen Reactions  . Doxycycline Hyclate     REACTION:  rash       Objective:   Physical Exam  Nursing note and vitals reviewed. Constitutional: He is oriented to person, place, and time. He appears well-developed and well-nourished. No distress.  HENT:  Head: Normocephalic and atraumatic.  Right Ear: External ear normal.  Left Ear: External ear normal.  Nose: Nose normal.  Oropharynx slightly erythematous, no exudate.  Bilateral tympanic membranes are normal.  Right maxillary sinus exquisitely tender to palpation. Left maxillary sinus nontender to palpation. Bilateral frontal sinuses nontender to palpation.  Eyes: Conjunctivae and EOM are normal. Pupils are equal, round, and reactive to light.  Neck: Normal range of motion. Neck supple. No JVD present.  Cardiovascular: Normal rate, regular rhythm, normal heart sounds and intact distal pulses.  Exam reveals no gallop and no friction rub.   No murmur heard. Pulmonary/Chest: Effort normal. No stridor. No respiratory distress. He has no wheezes. He has no rales. He exhibits no tenderness.  Mild rhonchi bilateral lung bases.  Musculoskeletal: Normal range of motion.  Lymphadenopathy:    He has no cervical adenopathy.  Neurological: He is alert and oriented to person, place, and time.  Skin: Skin is warm and dry. No rash noted. He is not diaphoretic. No erythema. No pallor.  Psychiatric: He has a normal mood and affect. His behavior is normal. Judgment and thought content normal.    Filed Vitals:   07/24/13 1315  BP: 120/82  Pulse: 80  Temp: 98.3 F (36.8 C)  Resp: 20    Lab Results  Component Value Date   WBC 4.8 02/07/2012   HGB 14.8 02/07/2012   HCT 43.7 02/07/2012   PLT  233.0 02/07/2012   GLUCOSE 103* 02/07/2012   CHOL 231* 02/07/2012   TRIG 165.0* 02/07/2012   HDL 41.10 02/07/2012   LDLDIRECT 158.3 02/07/2012   ALT 28 02/07/2012   AST 27 02/07/2012   NA 137 02/07/2012   K 3.9 02/07/2012   CL 100 02/07/2012   CREATININE 1.1 02/07/2012   BUN 15 02/07/2012   CO2 29 02/07/2012   TSH  1.19 02/07/2012   HGBA1C 5.8 02/13/2012       Assessment & Plan:  James Ballard was seen today for cough.  Diagnoses and associated orders for this visit:  Acute sinusitis - amoxicillin-clavulanate (AUGMENTIN) 875-125 MG per tablet; Take 1 tablet by mouth 2 (two) times daily. - benzonatate (TESSALON) 100 MG capsule; Take 1 capsule (100 mg total) by mouth 2 (two) times daily as needed for cough.    Plan to follow up in 1 week to reassess.  Patient Instructions  Augmentin twice a day for 10 days for sinus infection.  Tessalon Perles twice a day as needed for cough.  Continue Tylenol Cold to help you sleep.  Continue Mucinex to help break up thick secretions.  Drink plenty of water to stay hydrated.  Follow up in 1 week to reassess, or sooner if symptoms worsen or fail to improve.

## 2013-08-01 ENCOUNTER — Ambulatory Visit: Payer: BC Managed Care – PPO | Admitting: Physician Assistant

## 2013-10-01 ENCOUNTER — Telehealth: Payer: Self-pay | Admitting: Internal Medicine

## 2013-10-01 NOTE — Telephone Encounter (Signed)
Pt would like to know if you could recommend a good hemorid specialist. Pt has seen one in [place], but pt does not get down there anymore. (Pt will transfer to [place])

## 2014-02-04 ENCOUNTER — Other Ambulatory Visit (INDEPENDENT_AMBULATORY_CARE_PROVIDER_SITE_OTHER): Payer: BC Managed Care – PPO

## 2014-02-04 DIAGNOSIS — E785 Hyperlipidemia, unspecified: Secondary | ICD-10-CM

## 2014-02-04 DIAGNOSIS — Z0000 Encounter for general adult medical examination without abnormal findings: Secondary | ICD-10-CM

## 2014-02-04 LAB — BASIC METABOLIC PANEL
BUN: 17 mg/dL (ref 6–23)
CO2: 27 mEq/L (ref 19–32)
Calcium: 9.6 mg/dL (ref 8.4–10.5)
Chloride: 102 mEq/L (ref 96–112)
Creatinine, Ser: 1.1 mg/dL (ref 0.4–1.5)
GFR: 80.35 mL/min (ref >60.00)
Glucose, Bld: 87 mg/dL (ref 70–99)
Potassium: 4.5 mEq/L (ref 3.5–5.1)
Sodium: 137 mEq/L (ref 135–145)

## 2014-02-04 LAB — CBC WITH DIFFERENTIAL/PLATELET
Basophils Absolute: 0 10*3/uL (ref 0.0–0.1)
Basophils Relative: 0.5 % (ref 0.0–3.0)
Eosinophils Absolute: 0.1 10*3/uL (ref 0.0–0.7)
Eosinophils Relative: 1.6 % (ref 0.0–5.0)
HCT: 44.3 % (ref 39.0–52.0)
Hemoglobin: 14.8 g/dL (ref 13.0–17.0)
Lymphocytes Relative: 30 % (ref 12.0–46.0)
Lymphs Abs: 1.6 10*3/uL (ref 0.7–4.0)
MCHC: 33.4 g/dL (ref 30.0–36.0)
MCV: 83.9 fl (ref 78.0–100.0)
Monocytes Absolute: 0.6 10*3/uL (ref 0.1–1.0)
Monocytes Relative: 10.8 % (ref 3.0–12.0)
Neutro Abs: 3 10*3/uL (ref 1.4–7.7)
Neutrophils Relative %: 57.1 % (ref 43.0–77.0)
Platelets: 235 10*3/uL (ref 150.0–400.0)
RBC: 5.28 Mil/uL (ref 4.22–5.81)
RDW: 12.5 % (ref 11.5–15.5)
WBC: 5.2 10*3/uL (ref 4.0–10.5)

## 2014-02-04 LAB — HEPATIC FUNCTION PANEL
ALT: 21 U/L (ref 0–53)
AST: 21 U/L (ref 0–37)
Albumin: 4.6 g/dL (ref 3.5–5.2)
Alkaline Phosphatase: 58 U/L (ref 39–117)
Bilirubin, Direct: 0 mg/dL (ref 0.0–0.3)
Total Bilirubin: 1.1 mg/dL (ref 0.2–1.2)
Total Protein: 7.7 g/dL (ref 6.0–8.3)

## 2014-02-04 LAB — POCT URINALYSIS DIPSTICK
Bilirubin, UA: NEGATIVE
Blood, UA: NEGATIVE
Glucose, UA: NEGATIVE
Ketones, UA: NEGATIVE
Leukocytes, UA: NEGATIVE
Nitrite, UA: NEGATIVE
Protein, UA: NEGATIVE
Spec Grav, UA: 1.01
Urobilinogen, UA: 0.2
pH, UA: 5.5

## 2014-02-04 LAB — LDL CHOLESTEROL, DIRECT: Direct LDL: 132.2 mg/dL

## 2014-02-04 LAB — LIPID PANEL
Cholesterol: 233 mg/dL — ABNORMAL HIGH (ref 0–200)
HDL: 36.5 mg/dL — ABNORMAL LOW (ref >39.00)
NonHDL: 196.5
Total CHOL/HDL Ratio: 6
Triglycerides: 261 mg/dL — ABNORMAL HIGH (ref 0.0–149.0)
VLDL: 52.2 mg/dL — ABNORMAL HIGH (ref 0.0–40.0)

## 2014-02-04 LAB — TSH: TSH: 1.45 u[IU]/mL (ref 0.35–4.50)

## 2014-02-17 ENCOUNTER — Encounter: Payer: Self-pay | Admitting: Family

## 2014-02-17 ENCOUNTER — Ambulatory Visit (INDEPENDENT_AMBULATORY_CARE_PROVIDER_SITE_OTHER): Payer: BC Managed Care – PPO | Admitting: Family

## 2014-02-17 ENCOUNTER — Ambulatory Visit (INDEPENDENT_AMBULATORY_CARE_PROVIDER_SITE_OTHER): Payer: BC Managed Care – PPO

## 2014-02-17 VITALS — BP 118/72 | HR 65 | Ht 72.25 in | Wt 199.0 lb

## 2014-02-17 DIAGNOSIS — M545 Low back pain: Secondary | ICD-10-CM

## 2014-02-17 DIAGNOSIS — Z0000 Encounter for general adult medical examination without abnormal findings: Secondary | ICD-10-CM

## 2014-02-17 DIAGNOSIS — Z23 Encounter for immunization: Secondary | ICD-10-CM

## 2014-02-17 DIAGNOSIS — G43819 Other migraine, intractable, without status migrainosus: Secondary | ICD-10-CM

## 2014-02-17 DIAGNOSIS — E785 Hyperlipidemia, unspecified: Secondary | ICD-10-CM

## 2014-02-17 MED ORDER — KETOROLAC TROMETHAMINE 60 MG/2ML IM SOLN
60.0000 mg | Freq: Once | INTRAMUSCULAR | Status: AC
  Administered 2014-02-17: 60 mg via INTRAMUSCULAR

## 2014-02-17 MED ORDER — METHYLPREDNISOLONE 4 MG PO KIT: PACK | ORAL | Status: AC

## 2014-02-17 MED ORDER — CYCLOBENZAPRINE HCL 10 MG PO TABS: 10.0000 mg | ORAL_TABLET | Freq: Three times a day (TID) | ORAL | Status: DC | PRN

## 2014-02-17 NOTE — Patient Instructions (Signed)
Fat and Cholesterol Control Diet Fat and cholesterol levels in your blood and organs are influenced by your diet. High levels of fat and cholesterol may lead to diseases of the heart, small and large blood vessels, gallbladder, liver, and pancreas. CONTROLLING FAT AND CHOLESTEROL WITH DIET Although exercise and lifestyle factors are important, your diet is key. That is because certain foods are known to raise cholesterol and others to lower it. The goal is to balance foods for their effect on cholesterol and more importantly, to replace saturated and trans fat with other types of fat, such as monounsaturated fat, polyunsaturated fat, and omega-3 fatty acids. On average, a person should consume no more than 15 to 17 g of saturated fat daily. Saturated and trans fats are considered "bad" fats, and they will raise LDL cholesterol. Saturated fats are primarily found in animal products such as meats, butter, and cream. However, that does not mean you need to give up all your favorite foods. Today, there are good tasting, low-fat, low-cholesterol substitutes for most of the things you like to eat. Choose low-fat or nonfat alternatives. Choose round or loin cuts of red meat. These types of cuts are lowest in fat and cholesterol. Chicken (without the skin), fish, veal, and ground turkey breast are great choices. Eliminate fatty meats, such as hot dogs and salami. Even shellfish have little or no saturated fat. Have a 3 oz (85 g) portion when you eat lean meat, poultry, or fish. Trans fats are also called "partially hydrogenated oils." They are oils that have been scientifically manipulated so that they are solid at room temperature resulting in a longer shelf life and improved taste and texture of foods in which they are added. Trans fats are found in stick margarine, some tub margarines, cookies, crackers, and baked goods.  When baking and cooking, oils are a great substitute for butter. The monounsaturated oils are  especially beneficial since it is believed they lower LDL and raise HDL. The oils you should avoid entirely are saturated tropical oils, such as coconut and palm.  Remember to eat a lot from food groups that are naturally free of saturated and trans fat, including fish, fruit, vegetables, beans, grains (barley, rice, couscous, bulgur wheat), and pasta (without cream sauces).  IDENTIFYING FOODS THAT LOWER FAT AND CHOLESTEROL  Soluble fiber may lower your cholesterol. This type of fiber is found in fruits such as apples, vegetables such as broccoli, potatoes, and carrots, legumes such as beans, peas, and lentils, and grains such as barley. Foods fortified with plant sterols (phytosterol) may also lower cholesterol. You should eat at least 2 g per day of these foods for a cholesterol lowering effect.  Read package labels to identify low-saturated fats, trans fat free, and low-fat foods at the supermarket. Select cheeses that have only 2 to 3 g saturated fat per ounce. Use a heart-healthy tub margarine that is free of trans fats or partially hydrogenated oil. When buying baked goods (cookies, crackers), avoid partially hydrogenated oils. Breads and muffins should be made from whole grains (whole-wheat or whole oat flour, instead of "flour" or "enriched flour"). Buy non-creamy canned soups with reduced salt and no added fats.  FOOD PREPARATION TECHNIQUES  Never deep-fry. If you must fry, either stir-fry, which uses very little fat, or use non-stick cooking sprays. When possible, broil, bake, or roast meats, and steam vegetables. Instead of putting butter or margarine on vegetables, use lemon and herbs, applesauce, and cinnamon (for squash and sweet potatoes). Use nonfat   yogurt, salsa, and low-fat dressings for salads.  LOW-SATURATED FAT / LOW-FAT FOOD SUBSTITUTES Meats / Saturated Fat (g)  Avoid: Steak, marbled (3 oz/85 g) / 11 g  Choose: Steak, lean (3 oz/85 g) / 4 g  Avoid: Hamburger (3 oz/85 g) / 7  g  Choose: Hamburger, lean (3 oz/85 g) / 5 g  Avoid: Ham (3 oz/85 g) / 6 g  Choose: Ham, lean cut (3 oz/85 g) / 2.4 g  Avoid: Chicken, with skin, dark meat (3 oz/85 g) / 4 g  Choose: Chicken, skin removed, dark meat (3 oz/85 g) / 2 g  Avoid: Chicken, with skin, light meat (3 oz/85 g) / 2.5 g  Choose: Chicken, skin removed, light meat (3 oz/85 g) / 1 g Dairy / Saturated Fat (g)  Avoid: Whole milk (1 cup) / 5 g  Choose: Low-fat milk, 2% (1 cup) / 3 g  Choose: Low-fat milk, 1% (1 cup) / 1.5 g  Choose: Skim milk (1 cup) / 0.3 g  Avoid: Hard cheese (1 oz/28 g) / 6 g  Choose: Skim milk cheese (1 oz/28 g) / 2 to 3 g  Avoid: Cottage cheese, 4% fat (1 cup) / 6.5 g  Choose: Low-fat cottage cheese, 1% fat (1 cup) / 1.5 g  Avoid: Ice cream (1 cup) / 9 g  Choose: Sherbet (1 cup) / 2.5 g  Choose: Nonfat frozen yogurt (1 cup) / 0.3 g  Choose: Frozen fruit bar / trace  Avoid: Whipped cream (1 tbs) / 3.5 g  Choose: Nondairy whipped topping (1 tbs) / 1 g Condiments / Saturated Fat (g)  Avoid: Mayonnaise (1 tbs) / 2 g  Choose: Low-fat mayonnaise (1 tbs) / 1 g  Avoid: Butter (1 tbs) / 7 g  Choose: Extra light margarine (1 tbs) / 1 g  Avoid: Coconut oil (1 tbs) / 11.8 g  Choose: Olive oil (1 tbs) / 1.8 g  Choose: Corn oil (1 tbs) / 1.7 g  Choose: Safflower oil (1 tbs) / 1.2 g  Choose: Sunflower oil (1 tbs) / 1.4 g  Choose: Soybean oil (1 tbs) / 2.4 g  Choose: Canola oil (1 tbs) / 1 g Document Released: 02/21/2005 Document Revised: 06/18/2012 Document Reviewed: 05/22/2013 [employer] Patient Information 2015 [employer]. This information is not intended to replace advice given to you by your health care provider. Make sure you discuss any questions you have with your health care provider.  

## 2014-02-17 NOTE — Progress Notes (Signed)
Subjective:    Patient ID: James Ballard, male    DOB: 05/13/1965, 48 y.o.   MRN: [id number]  HPI 47 year old white male, nonsmoker, is in today for CPX. All immunizations and health maintenance protocols were reviewed with the patient and needed orders were placed. Appropriate screening laboratory values were ordered for the patient including screening of hyperlipidemia, renal function and hepatic function.  Medication reconciliation,  past medical history, social history, problem list and allergies were reviewed in detail with the patient  Goals were established with regard to weight loss, exercise, and  diet in compliance with medications.   Has complaints of thoracic back pain that is worse with movement. Pain 9/10. Reports injury 5 years ago, working out. Pain flares periodically. Has been working out approx 4-5 days a week at the gym. Reports have an MRI done in the past that was inconclusive.  Has a history of migraine headaches and continues to have them. Has an appointment scheduled with the headache clinic next month. Has been prescribed Imitrex that works but taste horrible and says the medication is worse than the actual headache.  Review of Systems  Constitutional: Negative.   HENT: Negative.   Eyes: Negative.   Respiratory: Negative.   Cardiovascular: Negative.   Gastrointestinal: Negative.   Endocrine: Negative.   Genitourinary: Negative.   Musculoskeletal: Positive for back pain.       Low back pain, worse with movement.   Skin: Negative.   Allergic/Immunologic: Negative.   Neurological: Negative.   Hematological: Negative.   Psychiatric/Behavioral: Negative.    Past Medical History  Diagnosis Date  . Hyperlipidemia     History   Social History  . Marital Status: Married    Spouse Name: N/A    Number of Children: N/A  . Years of Education: N/A   Occupational History  . Not on file.   Social History Main Topics  . Smoking status: Never Smoker   .  Smokeless tobacco: Not on file  . Alcohol Use: Not on file  . Drug Use: Not on file  . Sexual Activity: Not on file   Other Topics Concern  . Not on file   Social History Narrative    Past Surgical History  Procedure Laterality Date  . Ulnar nerve transposition    . Knee arthroscopy      left  . Sesmoid fx- excised      Family History  Problem Relation Age of Onset  . Heart disease Father     pacemaker--has been removed-may have never needed it  . Migraines Sister   . Hyperlipidemia Brother   . Migraines Brother     Allergies  Allergen Reactions  . Doxycycline Hyclate     REACTION: rash    No current outpatient prescriptions on file prior to visit.   No current facility-administered medications on file prior to visit.    BP 118/72 mmHg  Pulse 65  Ht 6' 0.25" (1.835 m)  Wt 199 lb (90.266 kg)  BMI 26.81 kg/m2chart    Objective:   Physical Exam  Constitutional: He is oriented to person, place, and time. He appears well-developed and well-nourished.  HENT:  Head: Normocephalic.  Right Ear: External ear normal.  Left Ear: External ear normal.  Nose: Nose normal.  Mouth/Throat: Oropharynx is clear and moist.  Eyes: Conjunctivae and EOM are normal. Pupils are equal, round, and reactive to light.  Neck: Normal range of motion. Neck supple. No thyromegaly present.  Cardiovascular:  Normal rate, regular rhythm and normal heart sounds.   Pulmonary/Chest: Effort normal and breath sounds normal.  Abdominal: Soft. Bowel sounds are normal.  Genitourinary: Rectum normal, prostate normal and penis normal.  Musculoskeletal: Normal range of motion.  Neurological: He is alert and oriented to person, place, and time. He has normal reflexes.  Skin: Skin is warm and dry.  Psychiatric: He has a normal mood and affect.          Assessment & Plan:  James Ballard was seen today for annual exam.  Diagnoses and associated orders for this visit:  Preventative health  care  Hyperlipemia  Low back pain without sciatica, unspecified back pain laterality - ketorolac (TORADOL) injection 60 mg; Inject 2 mLs (60 mg total) into the muscle once.  Other Orders - cyclobenzaprine (FLEXERIL) 10 MG tablet; Take 1 tablet (10 mg total) by mouth 3 (three) times daily as needed for muscle spasms. - methylPREDNISolone (MEDROL DOSEPAK) 4 MG tablet; follow package directions    Return in 6 weeks for recheck of cholesterol. Continue exercising 4-5 days per week to see if this makes a difference in his cholesterol. Medications sent to the pharmacy to help with back pain. Consider referral to orthopedics if no better.

## 2014-11-13 ENCOUNTER — Ambulatory Visit: Payer: Self-pay | Admitting: Podiatry

## 2014-11-18 ENCOUNTER — Encounter (HOSPITAL_COMMUNITY): Payer: Self-pay | Admitting: Vascular Surgery

## 2014-11-18 ENCOUNTER — Emergency Department (HOSPITAL_COMMUNITY): Payer: BLUE CROSS/BLUE SHIELD

## 2014-11-18 ENCOUNTER — Emergency Department (HOSPITAL_COMMUNITY)
Admission: EM | Admit: 2014-11-18 | Discharge: 2014-11-18 | Disposition: A | Discharge status: Home / Self Care | Payer: BLUE CROSS/BLUE SHIELD | Attending: Emergency Medicine | Admitting: Emergency Medicine

## 2014-11-18 DIAGNOSIS — K5900 Constipation, unspecified: Secondary | ICD-10-CM | POA: Insufficient documentation

## 2014-11-18 DIAGNOSIS — Z8639 Personal history of other endocrine, nutritional and metabolic disease: Secondary | ICD-10-CM | POA: Insufficient documentation

## 2014-11-18 DIAGNOSIS — K6289 Other specified diseases of anus and rectum: Secondary | ICD-10-CM

## 2014-11-18 LAB — COMPREHENSIVE METABOLIC PANEL
ALT: 116 U/L — ABNORMAL HIGH (ref 17–63)
AST: 45 U/L — ABNORMAL HIGH (ref 15–41)
Albumin: 4.4 g/dL (ref 3.5–5.0)
Alkaline Phosphatase: 94 U/L (ref 38–126)
Anion gap: 11 (ref 5–15)
BUN: 15 mg/dL (ref 6–20)
CO2: 25 mmol/L (ref 22–32)
Calcium: 9.5 mg/dL (ref 8.9–10.3)
Chloride: 101 mmol/L (ref 101–111)
Creatinine, Ser: 1 mg/dL (ref 0.61–1.24)
GFR calc Af Amer: >60 mL/min (ref >60)
GFR calc non Af Amer: >60 mL/min (ref >60)
Glucose, Bld: 102 mg/dL — ABNORMAL HIGH (ref 65–99)
Potassium: 4 mmol/L (ref 3.5–5.1)
Sodium: 137 mmol/L (ref 135–145)
Total Bilirubin: 1.2 mg/dL (ref 0.3–1.2)
Total Protein: 8 g/dL (ref 6.5–8.1)

## 2014-11-18 LAB — LIPASE, BLOOD: Lipase: 17 U/L — ABNORMAL LOW (ref 22–51)

## 2014-11-18 LAB — CBC
HCT: 43.7 % (ref 39.0–52.0)
Hemoglobin: 14.7 g/dL (ref 13.0–17.0)
MCH: 27.8 pg (ref 26.0–34.0)
MCHC: 33.6 g/dL (ref 30.0–36.0)
MCV: 82.8 fL (ref 78.0–100.0)
Platelets: 238 10*3/uL (ref 150–400)
RBC: 5.28 MIL/uL (ref 4.22–5.81)
RDW: 12.2 % (ref 11.5–15.5)
WBC: 7.8 10*3/uL (ref 4.0–10.5)

## 2014-11-18 MED ORDER — LIDOCAINE HCL 2 % EX GEL
1.0000 "application " | Freq: Once | CUTANEOUS | Status: AC
  Administered 2014-11-18: 1 via TOPICAL
  Filled 2014-11-18: qty 20

## 2014-11-18 MED ORDER — LIDOCAINE HCL 2 % EX GEL: 1.0000 "application " | CUTANEOUS | Status: DC | PRN

## 2014-11-18 NOTE — ED Notes (Signed)
Pt reports to the ED for eval of rectal pain and constipation. He reports he had an hemorrhoidectomy last Thursday in [place]. Pt reports he has been unable to have a BM since and he is concerned he may be impacted. He has tried several fleet enemas without relief. Reports increased rectal pain as well and difficulty urinating. Pt A&Ox4, resp e/u, and skin warm and dry.

## 2014-11-18 NOTE — ED Notes (Signed)
Patient is alert and orientedx4.  Patient was explained discharge instructions and they understood them with no questions.   

## 2014-11-18 NOTE — ED Notes (Signed)
Pt is aware urine is needed for testing, pt is unable to urinate at this time. Urinal at bedside.  

## 2014-11-18 NOTE — ED Provider Notes (Signed)
CSN: [id number]     Arrival date & time 11/18/14  1524 History   First MD Initiated Contact with Patient 11/18/14 2043     Chief Complaint  Patient presents with  . Constipation     (Consider location/radiation/quality/duration/timing/severity/associated sxs/prior Treatment) Patient is a 49 y.o. male presenting with constipation. The history is provided by the patient.  Constipation Severity:  Moderate Time since last bowel movement:  4 days Timing:  Constant Progression:  Unchanged Chronicity:  New Associated symptoms: no abdominal pain, no diarrhea, no fever and no vomiting    49 yo M with a chief complaint of constipation. Patient had a hemorrhoidectomy done recently and has been scared to have a bowel movement. Patient has tried MiraLAX as well as enemas with no relief. Patient talked with his surgeon who suggested he come to the emergency department. Patient denies any vomiting denies nausea fevers.  Past Medical History  Diagnosis Date  . Hyperlipidemia    Past Surgical History  Procedure Laterality Date  . Ulnar nerve transposition    . Knee arthroscopy      left  . Sesmoid fx- excised    . Hemorroidectomy     Family History  Problem Relation Age of Onset  . Heart disease Father     pacemaker--has been removed-may have never needed it  . Migraines Sister   . Hyperlipidemia Brother   . Migraines Brother    Social History  Substance Use Topics  . Smoking status: Never Smoker   . Smokeless tobacco: Never Used  . Alcohol Use: Yes     Comment: occasionally    Review of Systems  Constitutional: Negative for fever and chills.  HENT: Negative for congestion and facial swelling.   Eyes: Negative for discharge and visual disturbance.  Respiratory: Negative for shortness of breath.   Cardiovascular: Negative for chest pain and palpitations.  Gastrointestinal: Positive for constipation. Negative for vomiting, abdominal pain and diarrhea.  Musculoskeletal: Negative  for myalgias and arthralgias.  Skin: Negative for color change and rash.  Neurological: Negative for tremors, syncope and headaches.  Psychiatric/Behavioral: Negative for confusion and dysphoric mood.      Allergies  Doxycycline hyclate  Home Medications   Prior to Admission medications   Medication Sig Start Date End Date Taking? Authorizing Provider  HYDROcodone-acetaminophen (NORCO/VICODIN) 5-325 MG per tablet Take 1 tablet by mouth every 6 (six) hours as needed for moderate pain.   Yes Historical Provider, MD  cyclobenzaprine (FLEXERIL) 10 MG tablet Take 1 tablet (10 mg total) by mouth 3 (three) times daily as needed for muscle spasms. Patient not taking: Reported on 11/18/2014 02/17/14   Kennyth Arnold, FNP  lidocaine (XYLOCAINE) 2 % jelly Apply 1 application topically as needed. 11/18/14   Deno Etienne, DO   BP 130/89 mmHg  Pulse 70  Temp(Src) 98.5 F (36.9 C) (Oral)  Resp 20  SpO2 98% Physical Exam  Constitutional: He is oriented to person, place, and time. He appears well-developed and well-nourished.  HENT:  Head: Normocephalic and atraumatic.  Eyes: EOM are normal. Pupils are equal, round, and reactive to light.  Neck: Normal range of motion. Neck supple. No JVD present.  Cardiovascular: Normal rate and regular rhythm.  Exam reveals no gallop and no friction rub.   No murmur heard. Pulmonary/Chest: No respiratory distress. He has no wheezes.  Abdominal: He exhibits no distension. There is no rebound and no guarding.  Genitourinary: Rectal exam shows tenderness.  Extremely tender rectum. Difficult to perform due  to pain. No noted stool in the vault.  Musculoskeletal: Normal range of motion.  Neurological: He is alert and oriented to person, place, and time.  Skin: No rash noted. No pallor.  Psychiatric: He has a normal mood and affect. His behavior is normal.    ED Course  Procedures (including critical care time) Labs Review Labs Reviewed  LIPASE, BLOOD -  Abnormal; Notable for the following:    Lipase 17 (*)    All other components within normal limits  COMPREHENSIVE METABOLIC PANEL - Abnormal; Notable for the following:    Glucose, Bld 102 (*)    AST 45 (*)    ALT 116 (*)    All other components within normal limits  CBC  URINALYSIS, ROUTINE W REFLEX MICROSCOPIC (NOT AT [facility])    Imaging Review Dg Abd Acute W/chest  11/18/2014   CLINICAL DATA:  Constipation. Nausea for 5 days status post surgery.  EXAM: DG ABDOMEN ACUTE W/ 1V CHEST  COMPARISON:  Chest radiograph 05/06/2011  FINDINGS: Normal cardiac and mediastinal contours. No consolidative pulmonary opacities. No pleural effusion or pneumothorax.  Upright imaging demonstrates no free intraperitoneal air. Gas and stool within the colon. No dilated loops of small bowel are demonstrated within the central abdomen. Relative paucity of small bowel gas.  Regional skeleton is unremarkable.  IMPRESSION: No acute cardiopulmonary process.  Nonobstructed bowel gas pattern.   Electronically Signed   By: Lovey Newcomer M.D.   On: 11/18/2014 17:00   I have personally reviewed and evaluated these images and lab results as part of my medical decision-making.   EKG Interpretation None      MDM   Final diagnoses:  Constipation, unspecified constipation type  Rectal pain    49 yo M with a chief complaint of constipation. Painful rectal exam. Patient given lidocaine jelly. No noted stool in the vault left continue to do laxatives as well as suppositories.  11:52 PM:  I have discussed the diagnosis/risks/treatment options with the patient and family and believe the pt to be eligible for discharge home to follow-up with PCP. We also discussed returning to the ED immediately if new or worsening sx occur. We discussed the sx which are most concerning (e.g., uncontrolled vomiting) that necessitate immediate return. Medications administered to the patient during their visit and any new prescriptions provided to  the patient are listed below.  Medications given during this visit Medications  lidocaine (XYLOCAINE) 2 % jelly 1 application (1 application Topical Given 11/18/14 2129)    Discharge Medication List as of 11/18/2014  9:47 PM    START taking these medications   Details  lidocaine (XYLOCAINE) 2 % jelly Apply 1 application topically as needed., Starting 11/18/2014, Until Discontinued, Print         The patient appears reasonably screen and/or stabilized for discharge and I doubt any other medical condition or other [facility] requiring further screening, evaluation, or treatment in the ED at this time prior to discharge.      Deno Etienne, DO 11/18/14 2353

## 2014-11-18 NOTE — Discharge Instructions (Signed)
Use MiraLAX 8 scoops in 32 ounces of whatever liquid. Use a fleets enema Constipation Constipation is when a person:  Poops (has a bowel movement) less than 3 times a week.  Has a hard time pooping.  Has poop that is dry, hard, or bigger than normal. HOME CARE   Eat foods with a lot of fiber in them. This includes fruits, vegetables, beans, and whole grains such as brown rice.  Avoid fatty foods and foods with a lot of sugar. This includes french fries, hamburgers, cookies, candy, and soda.  If you are not getting enough fiber from food, take products with added fiber in them (supplements).  Drink enough fluid to keep your pee (urine) clear or pale yellow.  Exercise on a regular basis, or as told by your doctor.  Go to the restroom when you feel like you need to poop. Do not hold it.  Only take medicine as told by your doctor. Do not take medicines that help you poop (laxatives) without talking to your doctor first. GET HELP RIGHT AWAY IF:   You have bright red blood in your poop (stool).  Your constipation lasts more than 4 days or gets worse.  You have belly (abdominal) or butt (rectal) pain.  You have thin poop (as thin as a pencil).  You lose weight, and it cannot be explained. MAKE SURE YOU:   Understand these instructions.  Will watch your condition.  Will get help right away if you are not doing well or get worse. Document Released: 08/10/2007 Document Revised: 02/26/2013 Document Reviewed: 12/03/2012 [facility] Patient Information 2015 [place], [place]. This information is not intended to replace advice given to you by your health care provider. Make sure you discuss any questions you have with your health care provider.

## 2015-02-13 ENCOUNTER — Other Ambulatory Visit (INDEPENDENT_AMBULATORY_CARE_PROVIDER_SITE_OTHER): Payer: BLUE CROSS/BLUE SHIELD

## 2015-02-13 DIAGNOSIS — Z0000 Encounter for general adult medical examination without abnormal findings: Secondary | ICD-10-CM | POA: Diagnosis not present

## 2015-02-13 DIAGNOSIS — R7989 Other specified abnormal findings of blood chemistry: Secondary | ICD-10-CM

## 2015-02-13 LAB — CBC WITH DIFFERENTIAL/PLATELET
Basophils Absolute: 0 10*3/uL (ref 0.0–0.1)
Basophils Relative: 0.4 % (ref 0.0–3.0)
Eosinophils Absolute: 0.1 10*3/uL (ref 0.0–0.7)
Eosinophils Relative: 1.4 % (ref 0.0–5.0)
HCT: 45.5 % (ref 39.0–52.0)
Hemoglobin: 15.3 g/dL (ref 13.0–17.0)
Lymphocytes Relative: 19.7 % (ref 12.0–46.0)
Lymphs Abs: 1.2 10*3/uL (ref 0.7–4.0)
MCHC: 33.6 g/dL (ref 30.0–36.0)
MCV: 83.3 fl (ref 78.0–100.0)
Monocytes Absolute: 0.5 10*3/uL (ref 0.1–1.0)
Monocytes Relative: 8.1 % (ref 3.0–12.0)
Neutro Abs: 4.3 10*3/uL (ref 1.4–7.7)
Neutrophils Relative %: 70.4 % (ref 43.0–77.0)
Platelets: 221 10*3/uL (ref 150.0–400.0)
RBC: 5.46 Mil/uL (ref 4.22–5.81)
RDW: 12.6 % (ref 11.5–15.5)
WBC: 6.1 10*3/uL (ref 4.0–10.5)

## 2015-02-13 LAB — LIPID PANEL
Cholesterol: 225 mg/dL — ABNORMAL HIGH (ref 0–200)
HDL: 43.1 mg/dL (ref >39.00)
NonHDL: 182.24
Total CHOL/HDL Ratio: 5
Triglycerides: 219 mg/dL — ABNORMAL HIGH (ref 0.0–149.0)
VLDL: 43.8 mg/dL — ABNORMAL HIGH (ref 0.0–40.0)

## 2015-02-13 LAB — HEPATIC FUNCTION PANEL
ALT: 18 U/L (ref 0–53)
AST: 17 U/L (ref 0–37)
Albumin: 4.7 g/dL (ref 3.5–5.2)
Alkaline Phosphatase: 52 U/L (ref 39–117)
Bilirubin, Direct: 0.1 mg/dL (ref 0.0–0.3)
Total Bilirubin: 0.5 mg/dL (ref 0.2–1.2)
Total Protein: 7.1 g/dL (ref 6.0–8.3)

## 2015-02-13 LAB — TSH: TSH: 1.57 u[IU]/mL (ref 0.35–4.50)

## 2015-02-13 LAB — BASIC METABOLIC PANEL
BUN: 17 mg/dL (ref 6–23)
CO2: 30 mEq/L (ref 19–32)
Calcium: 9.9 mg/dL (ref 8.4–10.5)
Chloride: 103 mEq/L (ref 96–112)
Creatinine, Ser: 1.03 mg/dL (ref 0.40–1.50)
GFR: 81.79 mL/min (ref >60.00)
Glucose, Bld: 94 mg/dL (ref 70–99)
Potassium: 4.8 mEq/L (ref 3.5–5.1)
Sodium: 140 mEq/L (ref 135–145)

## 2015-02-13 LAB — LDL CHOLESTEROL, DIRECT: Direct LDL: 143 mg/dL

## 2015-02-13 LAB — TESTOSTERONE: Testosterone: 335.22 ng/dL (ref 300.00–890.00)

## 2015-02-18 ENCOUNTER — Ambulatory Visit (INDEPENDENT_AMBULATORY_CARE_PROVIDER_SITE_OTHER): Payer: BLUE CROSS/BLUE SHIELD | Admitting: Family Medicine

## 2015-02-18 ENCOUNTER — Encounter: Payer: Self-pay | Admitting: Family Medicine

## 2015-02-18 VITALS — BP 130/82 | HR 84 | Temp 98.3°F | Resp 16 | Ht 72.25 in | Wt 189.2 lb

## 2015-02-18 DIAGNOSIS — Z23 Encounter for immunization: Secondary | ICD-10-CM | POA: Diagnosis not present

## 2015-02-18 DIAGNOSIS — Z0000 Encounter for general adult medical examination without abnormal findings: Secondary | ICD-10-CM

## 2015-02-18 MED ORDER — ZOLMITRIPTAN 2.5 MG PO TABS: 2.5000 mg | ORAL_TABLET | Freq: Once | ORAL | Status: DC

## 2015-02-18 NOTE — Progress Notes (Signed)
Pre visit review using our clinic review tool, if applicable. No additional management support is needed unless otherwise documented below in the visit note. 

## 2015-02-18 NOTE — Progress Notes (Signed)
   Subjective:    Patient ID: James Ballard, male    DOB: 08/11/1965, 49 y.o.   MRN: [id number]  HPI Patient seen for complete physical. Generally very healthy. He has infrequent migraines usually relieved with Zomig.  Requesting refills. Nonsmoker. Recent hemorrhoid surgery which is curtailed his exercise. He plans to start back soon No alcohol use. Immunizations up-to-date with exception of needs flu vaccine  Past Medical History  Diagnosis Date  . Hyperlipidemia    Past Surgical History  Procedure Laterality Date  . Ulnar nerve transposition    . Knee arthroscopy      left  . Sesmoid fx- excised    . Hemorroidectomy      reports that he has never smoked. He has never used smokeless tobacco. He reports that he drinks alcohol. He reports that he does not use illicit drugs. family history includes Heart disease in his father; Hyperlipidemia in his brother; Migraines in his brother and sister. Allergies  Allergen Reactions  . Doxycycline Hyclate     REACTION: rash      Review of Systems  Constitutional: Negative for fever, activity change, appetite change and fatigue.  HENT: Negative for congestion, ear pain and trouble swallowing.   Eyes: Negative for pain and visual disturbance.  Respiratory: Negative for cough, shortness of breath and wheezing.   Cardiovascular: Negative for chest pain and palpitations.  Gastrointestinal: Negative for nausea, vomiting, abdominal pain, diarrhea, constipation, blood in stool, abdominal distention and rectal pain.  Genitourinary: Negative for dysuria, hematuria and testicular pain.  Musculoskeletal: Negative for joint swelling and arthralgias.  Skin: Negative for rash.  Neurological: Negative for dizziness, syncope and headaches.  Hematological: Negative for adenopathy.  Psychiatric/Behavioral: Negative for confusion and dysphoric mood.       Objective:   Physical Exam  Constitutional: He is oriented to person, place, and time. He  appears well-developed and well-nourished. No distress.  HENT:  Head: Normocephalic and atraumatic.  Right Ear: External ear normal.  Left Ear: External ear normal.  Mouth/Throat: Oropharynx is clear and moist.  Eyes: Conjunctivae and EOM are normal. Pupils are equal, round, and reactive to light.  Neck: Normal range of motion. Neck supple. No thyromegaly present.  Cardiovascular: Normal rate, regular rhythm and normal heart sounds.   No murmur heard. Pulmonary/Chest: No respiratory distress. He has no wheezes. He has no rales.  Abdominal: Soft. Bowel sounds are normal. He exhibits no distension and no mass. There is no tenderness. There is no rebound and no guarding.  Musculoskeletal: He exhibits no edema.  Lymphadenopathy:    He has no cervical adenopathy.  Neurological: He is alert and oriented to person, place, and time. He displays normal reflexes. No cranial nerve deficit.  Skin: No rash noted.  Psychiatric: He has a normal mood and affect.          Assessment & Plan:  Physical exam. Labs reviewed. Mild dyslipidemia. 10 year risk of CAD 4.6%. Information on lowering triglycerides nonpharmacologically given. Increase consistency of exercise. Flu vaccine given

## 2015-02-18 NOTE — Patient Instructions (Signed)
DASH Eating Plan DASH stands for "Dietary Approaches to Stop Hypertension." The DASH eating plan is a healthy eating plan that has been shown to reduce high blood pressure (hypertension). Additional health benefits may include reducing the risk of type 2 diabetes mellitus, heart disease, and stroke. The DASH eating plan may also help with weight loss. WHAT DO I NEED TO KNOW ABOUT THE DASH EATING PLAN? For the DASH eating plan, you will follow these general guidelines: Choose foods with a percent daily value for sodium of less than 5% (as listed on the food label). Use salt-free seasonings or herbs instead of table salt or sea salt. Check with your health care provider or pharmacist before using salt substitutes. Eat lower-sodium products, often labeled as "lower sodium" or "no salt added." Eat fresh foods. Eat more vegetables, fruits, and low-fat dairy products. Choose whole grains. Look for the word "whole" as the first word in the ingredient list. Choose fish and skinless chicken or [place] more often than red meat. Limit fish, poultry, and meat to 6 oz (170 g) each day. Limit sweets, desserts, sugars, and sugary drinks. Choose heart-healthy fats. Limit cheese to 1 oz (28 g) per day. Eat more home-cooked food and less restaurant, buffet, and fast food. Limit fried foods. Cook foods using methods other than frying. Limit canned vegetables. If you do use them, rinse them well to decrease the sodium. When eating at a restaurant, ask that your food be prepared with less salt, or no salt if possible. WHAT FOODS CAN I EAT? Seek help from a dietitian for individual calorie needs. Grains Whole grain or whole wheat bread. Brown rice. Whole grain or whole wheat pasta. Quinoa, bulgur, and whole grain cereals. Low-sodium cereals. Corn or whole wheat flour tortillas. Whole grain cornbread. Whole grain crackers. Low-sodium crackers. Vegetables Fresh or frozen vegetables (raw, steamed, roasted, or  grilled). Low-sodium or reduced-sodium tomato and vegetable juices. Low-sodium or reduced-sodium tomato sauce and paste. Low-sodium or reduced-sodium canned vegetables.  Fruits All fresh, canned (in natural juice), or frozen fruits. Meat and Other Protein Products Ground beef (85% or leaner), grass-fed beef, or beef trimmed of fat. Skinless chicken or [place]. Ground chicken or [place]. Pork trimmed of fat. All fish and seafood. Eggs. Dried beans, peas, or lentils. Unsalted nuts and seeds. Unsalted canned beans. Dairy Low-fat dairy products, such as skim or 1% milk, 2% or reduced-fat cheeses, low-fat ricotta or cottage cheese, or plain low-fat yogurt. Low-sodium or reduced-sodium cheeses. Fats and Oils Tub margarines without trans fats. Light or reduced-fat mayonnaise and salad dressings (reduced sodium). Avocado. Safflower, olive, or canola oils. Natural peanut or almond butter. Other Unsalted popcorn and pretzels. The items listed above may not be a complete list of recommended foods or beverages. Contact your dietitian for more options. WHAT FOODS ARE NOT RECOMMENDED? Grains White bread. White pasta. White rice. Refined cornbread. Bagels and croissants. Crackers that contain trans fat. Vegetables Creamed or fried vegetables. Vegetables in a cheese sauce. Regular canned vegetables. Regular canned tomato sauce and paste. Regular tomato and vegetable juices. Fruits Dried fruits. Canned fruit in light or heavy syrup. Fruit juice. Meat and Other Protein Products Fatty cuts of meat. Ribs, chicken wings, bacon, sausage, bologna, salami, chitterlings, fatback, hot dogs, bratwurst, and packaged luncheon meats. Salted nuts and seeds. Canned beans with salt. Dairy Whole or 2% milk, cream, half-and-half, and cream cheese. Whole-fat or sweetened yogurt. Full-fat cheeses or blue cheese. Nondairy creamers and whipped toppings. Processed cheese, cheese spreads, or cheese curds.  Condiments Onion and garlic  salt, seasoned salt, table salt, and sea salt. Canned and packaged gravies. Worcestershire sauce. Tartar sauce. Barbecue sauce. Teriyaki sauce. Soy sauce, including reduced sodium. Steak sauce. Fish sauce. Oyster sauce. Cocktail sauce. Horseradish. Ketchup and mustard. Meat flavorings and tenderizers. Bouillon cubes. Hot sauce. Tabasco sauce. Marinades. Taco seasonings. Relishes. Fats and Oils Butter, stick margarine, lard, shortening, ghee, and bacon fat. Coconut, palm kernel, or palm oils. Regular salad dressings. Other Pickles and olives. Salted popcorn and pretzels. The items listed above may not be a complete list of foods and beverages to avoid. Contact your dietitian for more information. WHERE CAN I FIND MORE INFORMATION? National Heart, Lung, and Blood Institute: travelstabloid.com   This information is not intended to replace advice given to you by your health care provider. Make sure you discuss any questions you have with your health care provider.   Document Released: 02/10/2011 Document Revised: 03/14/2014 Document Reviewed: 12/26/2012 Elsevier Interactive Patient Education 2016 [place] Choices to Lower Your Triglycerides Triglycerides are a type of fat in your blood. High levels of triglycerides can increase the risk of heart disease and stroke. If your triglyceride levels are high, the foods you eat and your eating habits are very important. Choosing the right foods can help lower your triglycerides.  WHAT GENERAL GUIDELINES DO I NEED TO FOLLOW?  Lose weight if you are overweight.   Limit or avoid alcohol.   Fill one half of your plate with vegetables and green salads.   Limit fruit to two servings a day. Choose fruit instead of juice.   Make one fourth of your plate whole grains. Look for the word "whole" as the first word in the ingredient list.  Fill one fourth of your plate with lean protein foods.  Enjoy fatty fish  (such as salmon, mackerel, sardines, and tuna) three times a week.   Choose healthy fats.   Limit foods high in starch and sugar.  Eat more home-cooked food and less restaurant, buffet, and fast food.  Limit fried foods.  Cook foods using methods other than frying.  Limit saturated fats.  Check ingredient lists to avoid foods with partially hydrogenated oils (trans fats) in them. WHAT FOODS CAN I EAT?  Grains Whole grains, such as whole wheat or whole grain breads, crackers, cereals, and pasta. Unsweetened oatmeal, bulgur, barley, quinoa, or brown rice. Corn or whole wheat flour tortillas.  Vegetables Fresh or frozen vegetables (raw, steamed, roasted, or grilled). Green salads. Fruits All fresh, canned (in natural juice), or frozen fruits. Meat and Other Protein Products Ground beef (85% or leaner), grass-fed beef, or beef trimmed of fat. Skinless chicken or [place]. Ground chicken or [place]. Pork trimmed of fat. All fish and seafood. Eggs. Dried beans, peas, or lentils. Unsalted nuts or seeds. Unsalted canned or dry beans. Dairy Low-fat dairy products, such as skim or 1% milk, 2% or reduced-fat cheeses, low-fat ricotta or cottage cheese, or plain low-fat yogurt. Fats and Oils Tub margarines without trans fats. Light or reduced-fat mayonnaise and salad dressings. Avocado. Safflower, olive, or canola oils. Natural peanut or almond butter. The items listed above may not be a complete list of recommended foods or beverages. Contact your dietitian for more options. WHAT FOODS ARE NOT RECOMMENDED?  Grains White bread. White pasta. White rice. Cornbread. Bagels, pastries, and croissants. Crackers that contain trans fat. Vegetables White potatoes. Corn. Creamed or fried vegetables. Vegetables in a cheese sauce. Fruits Dried fruits. Canned fruit in light or heavy  syrup. Fruit juice. Meat and Other Protein Products Fatty cuts of meat. Ribs, chicken wings, bacon, sausage, bologna, salami,  chitterlings, fatback, hot dogs, bratwurst, and packaged luncheon meats. Dairy Whole or 2% milk, cream, half-and-half, and cream cheese. Whole-fat or sweetened yogurt. Full-fat cheeses. Nondairy creamers and whipped toppings. Processed cheese, cheese spreads, or cheese curds. Sweets and Desserts Corn syrup, sugars, honey, and molasses. Candy. Jam and jelly. Syrup. Sweetened cereals. Cookies, pies, cakes, donuts, muffins, and ice cream. Fats and Oils Butter, stick margarine, lard, shortening, ghee, or bacon fat. Coconut, palm kernel, or palm oils. Beverages Alcohol. Sweetened drinks (such as sodas, lemonade, and fruit drinks or punches). The items listed above may not be a complete list of foods and beverages to avoid. Contact your dietitian for more information.   This information is not intended to replace advice given to you by your health care provider. Make sure you discuss any questions you have with your health care provider.   Document Released: 12/10/2003 Document Revised: 03/14/2014 Document Reviewed: 12/26/2012 Elsevier Interactive Patient Education [employer].

## 2015-12-08 DIAGNOSIS — M545 Low back pain: Secondary | ICD-10-CM | POA: Diagnosis not present

## 2015-12-08 DIAGNOSIS — M6281 Muscle weakness (generalized): Secondary | ICD-10-CM | POA: Diagnosis not present

## 2015-12-15 DIAGNOSIS — M6281 Muscle weakness (generalized): Secondary | ICD-10-CM | POA: Diagnosis not present

## 2015-12-15 DIAGNOSIS — M545 Low back pain: Secondary | ICD-10-CM | POA: Diagnosis not present

## 2015-12-17 DIAGNOSIS — M545 Low back pain: Secondary | ICD-10-CM | POA: Diagnosis not present

## 2015-12-17 DIAGNOSIS — M6281 Muscle weakness (generalized): Secondary | ICD-10-CM | POA: Diagnosis not present

## 2015-12-22 DIAGNOSIS — M6281 Muscle weakness (generalized): Secondary | ICD-10-CM | POA: Diagnosis not present

## 2015-12-22 DIAGNOSIS — M545 Low back pain: Secondary | ICD-10-CM | POA: Diagnosis not present

## 2016-03-07 HISTORY — PX: COLONOSCOPY: SHX174

## 2016-03-30 DIAGNOSIS — M9903 Segmental and somatic dysfunction of lumbar region: Secondary | ICD-10-CM | POA: Diagnosis not present

## 2016-03-30 DIAGNOSIS — M545 Low back pain: Secondary | ICD-10-CM | POA: Diagnosis not present

## 2016-03-30 DIAGNOSIS — M9901 Segmental and somatic dysfunction of cervical region: Secondary | ICD-10-CM | POA: Diagnosis not present

## 2016-04-13 ENCOUNTER — Encounter: Payer: Self-pay | Admitting: Podiatry

## 2016-04-13 ENCOUNTER — Ambulatory Visit (INDEPENDENT_AMBULATORY_CARE_PROVIDER_SITE_OTHER): Payer: BLUE CROSS/BLUE SHIELD

## 2016-04-13 ENCOUNTER — Ambulatory Visit (INDEPENDENT_AMBULATORY_CARE_PROVIDER_SITE_OTHER): Payer: BLUE CROSS/BLUE SHIELD | Admitting: Podiatry

## 2016-04-13 VITALS — HR 80 | Resp 16 | Ht 74.0 in | Wt 185.0 lb

## 2016-04-13 DIAGNOSIS — S99922A Unspecified injury of left foot, initial encounter: Secondary | ICD-10-CM | POA: Diagnosis not present

## 2016-04-13 DIAGNOSIS — M2580 Other specified joint disorders, unspecified joint: Secondary | ICD-10-CM

## 2016-04-13 DIAGNOSIS — M7752 Other enthesopathy of left foot: Secondary | ICD-10-CM

## 2016-04-13 DIAGNOSIS — M79672 Pain in left foot: Secondary | ICD-10-CM

## 2016-04-13 MED ORDER — MELOXICAM 15 MG PO TABS
15.0000 mg | ORAL_TABLET | Freq: Every day | ORAL | 1 refills | Status: AC
Start: 2016-04-13 — End: 2016-05-13

## 2016-04-18 DIAGNOSIS — M47816 Spondylosis without myelopathy or radiculopathy, lumbar region: Secondary | ICD-10-CM | POA: Diagnosis not present

## 2016-04-24 NOTE — Progress Notes (Signed)
   Subjective:  Patient presents today for a history of left plantar forefoot pain which is been going on for approximately one day. Patient states that he tripped and hit the bottom of his foot. Patient is extremely concerned because he has a history of a sesamoidectomy to the tibial sesamoid left foot approximately 10 years ago. Patient also plays a lot of recreational tennis.    Objective/Physical Exam General: The patient is alert and oriented x3 in no acute distress.  Dermatology: Skin is warm, dry and supple bilateral lower extremities. Negative for open lesions or macerations.  Vascular: Palpable pedal pulses bilaterally. No edema or erythema noted. Capillary refill within normal limits.  Neurological: Epicritic and protective threshold grossly intact bilaterally.   Musculoskeletal Exam: Significant pain on palpation noted to the plantar aspect of the first MPJ left foot. Range of motion within normal limits to all pedal and ankle joints bilateral. Muscle strength 5/5 in all groups bilateral.   Radiographic Exam:  Normal osseous mineralization. Joint spaces preserved. No fracture/dislocation/boney destruction. X-rays appear negative for sesamoid fracture to the fibular sesamoid.   Assessment: #1 sesamoiditis with first MPJ capsulitis left foot #2 history of tibial sesamoidectomy left foot #3 pain in left foot   Plan of Care:  #1 Patient was evaluated. #2 today we discussed conservative modalities to alleviate symptoms. Postoperative shoe was dispensed today. #3 prescription for meloxicam 15 mg #4 return to clinic in 4 weeks   Edrick Kins, DPM [facility]  Dr. Edrick Kins, [address]                                        [place], [place] [address]                Office [phone]  Fax [phone]

## 2016-04-26 DIAGNOSIS — M47816 Spondylosis without myelopathy or radiculopathy, lumbar region: Secondary | ICD-10-CM | POA: Diagnosis not present

## 2016-04-26 DIAGNOSIS — M47817 Spondylosis without myelopathy or radiculopathy, lumbosacral region: Secondary | ICD-10-CM | POA: Diagnosis not present

## 2016-04-26 DIAGNOSIS — M545 Low back pain: Secondary | ICD-10-CM | POA: Diagnosis not present

## 2016-05-11 ENCOUNTER — Ambulatory Visit (INDEPENDENT_AMBULATORY_CARE_PROVIDER_SITE_OTHER): Payer: BLUE CROSS/BLUE SHIELD

## 2016-05-11 ENCOUNTER — Ambulatory Visit (INDEPENDENT_AMBULATORY_CARE_PROVIDER_SITE_OTHER): Payer: BLUE CROSS/BLUE SHIELD | Admitting: Podiatry

## 2016-05-11 DIAGNOSIS — M7752 Other enthesopathy of left foot: Secondary | ICD-10-CM

## 2016-05-11 DIAGNOSIS — M2580 Other specified joint disorders, unspecified joint: Secondary | ICD-10-CM | POA: Diagnosis not present

## 2016-05-11 MED ORDER — BETAMETHASONE SOD PHOS & ACET 6 (3-3) MG/ML IJ SUSP: 3.0000 mg | Freq: Once | INTRAMUSCULAR | Status: DC

## 2016-05-11 NOTE — Progress Notes (Signed)
   Subjective:  Patient presents today for follow-up evaluation of sesamoiditis to the first MPJ left foot. Patient has a history of a tibial sesamoidectomy approximately 10 years ago. Patient has been wearing his postoperative shoe for the last 4 weeks states it has not been helping. She still has throbbing pain. Patient is also been taking meloxicam 15 mg. Patient is extremely concerned because he has a history of a sesamoidectomy to the tibial sesamoid left foot approximately 10 years ago. Patient also plays a lot of recreational tennis.    Objective/Physical Exam General: The patient is alert and oriented x3 in no acute distress.  Dermatology: Skin is warm, dry and supple bilateral lower extremities. Negative for open lesions or macerations.  Vascular: Palpable pedal pulses bilaterally. No edema or erythema noted. Capillary refill within normal limits.  Neurological: Epicritic and protective threshold grossly intact bilaterally.   Musculoskeletal Exam: Significant pain on palpation noted to the plantar aspect of the first MPJ left foot. Range of motion within normal limits to all pedal and ankle joints bilateral. Muscle strength 5/5 in all groups bilateral.   Radiographic Exam:  Normal osseous mineralization. Joint spaces preserved. No fracture/dislocation/boney destruction. X-rays appear negative for sesamoid fracture to the fibular sesamoid.   Assessment: #1 sesamoiditis with first MPJ capsulitis left foot #2 history of tibial sesamoidectomy left foot #3 pain in left foot   Plan of Care:  #1 Patient was evaluated. #2 injection of 0.5 mL Celestone Soluspan injected into the first MPJ left foot. #3 continue meloxicam 15 mg #4 continue either postoperative shoe or good supportive tennis shoes that do not band and the toe box.  #5 return to clinic in 4 weeks. If patient is not improved then we will proceed with either a CT scan or MRI   Edrick Kins, DPM [facility]  Dr. Edrick Kins, [address]                                        [place], [place] [address]                Office [phone]  Fax [phone]

## 2016-06-08 ENCOUNTER — Ambulatory Visit: Payer: BLUE CROSS/BLUE SHIELD | Admitting: Podiatry

## 2016-06-13 ENCOUNTER — Ambulatory Visit (INDEPENDENT_AMBULATORY_CARE_PROVIDER_SITE_OTHER): Payer: BLUE CROSS/BLUE SHIELD | Admitting: Podiatry

## 2016-06-13 DIAGNOSIS — M7752 Other enthesopathy of left foot: Secondary | ICD-10-CM | POA: Diagnosis not present

## 2016-06-13 DIAGNOSIS — M2580 Other specified joint disorders, unspecified joint: Secondary | ICD-10-CM

## 2016-06-13 NOTE — Progress Notes (Signed)
   Subjective:  Patient presents today for follow-up evaluation of sesamoiditis to the first MPJ left foot. Patient has a history of a tibial sesamoidectomy approximately 10 years ago. He states his pain is somewhat better although he is still experiencing some pain. He is also requesting new orthotics. He has no new complaints.    Objective/Physical Exam General: The patient is alert and oriented x3 in no acute distress.  Dermatology: Skin is warm, dry and supple bilateral lower extremities. Negative for open lesions or macerations.  Vascular: Palpable pedal pulses bilaterally. No edema or erythema noted. Capillary refill within normal limits.  Neurological: Epicritic and protective threshold grossly intact bilaterally.   Musculoskeletal Exam: Moderate pain on palpation noted to the plantar aspect of the first MPJ left foot. Range of motion within normal limits to all pedal and ankle joints bilateral. Muscle strength 5/5 in all groups bilateral.   Assessment: #1 sesamoiditis with first MPJ capsulitis left foot #2 history of tibial sesamoidectomy left foot #3 pain in left foot   Plan of Care:  #1 Patient was evaluated. #2 injection of 0.5 mL Celestone Soluspan injected into the first MPJ left foot. #3 continue meloxicam 15 mg #4 continue either postoperative shoe or good supportive tennis shoes that do not band and the toe box.  #5 patient scanned for custom molded orthotics #6 return to clinic in 4 weeks.   Edrick Kins, DPM [facility]  Dr. Edrick Kins, [address]                                        [place], [place] [address]                Office [phone]  Fax [phone]

## 2016-06-15 MED ORDER — BETAMETHASONE SOD PHOS & ACET 6 (3-3) MG/ML IJ SUSP: 3.0000 mg | Freq: Once | INTRAMUSCULAR | Status: DC

## 2016-06-29 DIAGNOSIS — K641 Second degree hemorrhoids: Secondary | ICD-10-CM | POA: Diagnosis not present

## 2016-07-13 ENCOUNTER — Other Ambulatory Visit: Payer: BLUE CROSS/BLUE SHIELD

## 2016-08-08 DIAGNOSIS — M9903 Segmental and somatic dysfunction of lumbar region: Secondary | ICD-10-CM | POA: Diagnosis not present

## 2016-08-08 DIAGNOSIS — M545 Low back pain: Secondary | ICD-10-CM | POA: Diagnosis not present

## 2016-08-08 DIAGNOSIS — M9904 Segmental and somatic dysfunction of sacral region: Secondary | ICD-10-CM | POA: Diagnosis not present

## 2016-08-08 DIAGNOSIS — M9905 Segmental and somatic dysfunction of pelvic region: Secondary | ICD-10-CM | POA: Diagnosis not present

## 2016-08-11 DIAGNOSIS — M545 Low back pain: Secondary | ICD-10-CM | POA: Diagnosis not present

## 2016-08-11 DIAGNOSIS — M9904 Segmental and somatic dysfunction of sacral region: Secondary | ICD-10-CM | POA: Diagnosis not present

## 2016-08-11 DIAGNOSIS — M9903 Segmental and somatic dysfunction of lumbar region: Secondary | ICD-10-CM | POA: Diagnosis not present

## 2016-08-11 DIAGNOSIS — M9905 Segmental and somatic dysfunction of pelvic region: Secondary | ICD-10-CM | POA: Diagnosis not present

## 2016-08-15 DIAGNOSIS — M545 Low back pain: Secondary | ICD-10-CM | POA: Diagnosis not present

## 2016-08-15 DIAGNOSIS — M9904 Segmental and somatic dysfunction of sacral region: Secondary | ICD-10-CM | POA: Diagnosis not present

## 2016-08-15 DIAGNOSIS — M9903 Segmental and somatic dysfunction of lumbar region: Secondary | ICD-10-CM | POA: Diagnosis not present

## 2016-08-15 DIAGNOSIS — M9905 Segmental and somatic dysfunction of pelvic region: Secondary | ICD-10-CM | POA: Diagnosis not present

## 2016-08-30 DIAGNOSIS — M9902 Segmental and somatic dysfunction of thoracic region: Secondary | ICD-10-CM | POA: Diagnosis not present

## 2016-08-30 DIAGNOSIS — M9904 Segmental and somatic dysfunction of sacral region: Secondary | ICD-10-CM | POA: Diagnosis not present

## 2016-08-30 DIAGNOSIS — M5137 Other intervertebral disc degeneration, lumbosacral region: Secondary | ICD-10-CM | POA: Diagnosis not present

## 2016-08-30 DIAGNOSIS — M9903 Segmental and somatic dysfunction of lumbar region: Secondary | ICD-10-CM | POA: Diagnosis not present

## 2016-09-01 DIAGNOSIS — M9902 Segmental and somatic dysfunction of thoracic region: Secondary | ICD-10-CM | POA: Diagnosis not present

## 2016-09-01 DIAGNOSIS — M5137 Other intervertebral disc degeneration, lumbosacral region: Secondary | ICD-10-CM | POA: Diagnosis not present

## 2016-09-01 DIAGNOSIS — M9904 Segmental and somatic dysfunction of sacral region: Secondary | ICD-10-CM | POA: Diagnosis not present

## 2016-09-01 DIAGNOSIS — M9903 Segmental and somatic dysfunction of lumbar region: Secondary | ICD-10-CM | POA: Diagnosis not present

## 2016-09-08 DIAGNOSIS — M9903 Segmental and somatic dysfunction of lumbar region: Secondary | ICD-10-CM | POA: Diagnosis not present

## 2016-09-08 DIAGNOSIS — M9904 Segmental and somatic dysfunction of sacral region: Secondary | ICD-10-CM | POA: Diagnosis not present

## 2016-09-08 DIAGNOSIS — M5137 Other intervertebral disc degeneration, lumbosacral region: Secondary | ICD-10-CM | POA: Diagnosis not present

## 2016-09-08 DIAGNOSIS — M9902 Segmental and somatic dysfunction of thoracic region: Secondary | ICD-10-CM | POA: Diagnosis not present

## 2016-09-13 DIAGNOSIS — M9902 Segmental and somatic dysfunction of thoracic region: Secondary | ICD-10-CM | POA: Diagnosis not present

## 2016-09-13 DIAGNOSIS — M9904 Segmental and somatic dysfunction of sacral region: Secondary | ICD-10-CM | POA: Diagnosis not present

## 2016-09-13 DIAGNOSIS — M5137 Other intervertebral disc degeneration, lumbosacral region: Secondary | ICD-10-CM | POA: Diagnosis not present

## 2016-09-13 DIAGNOSIS — M9903 Segmental and somatic dysfunction of lumbar region: Secondary | ICD-10-CM | POA: Diagnosis not present

## 2016-10-18 DIAGNOSIS — M545 Low back pain: Secondary | ICD-10-CM | POA: Diagnosis not present

## 2016-10-18 DIAGNOSIS — M461 Sacroiliitis, not elsewhere classified: Secondary | ICD-10-CM | POA: Diagnosis not present

## 2016-11-21 DIAGNOSIS — M545 Low back pain: Secondary | ICD-10-CM | POA: Diagnosis not present

## 2016-11-21 DIAGNOSIS — M47816 Spondylosis without myelopathy or radiculopathy, lumbar region: Secondary | ICD-10-CM | POA: Diagnosis not present

## 2016-11-21 DIAGNOSIS — M461 Sacroiliitis, not elsewhere classified: Secondary | ICD-10-CM | POA: Diagnosis not present

## 2016-11-21 DIAGNOSIS — M47817 Spondylosis without myelopathy or radiculopathy, lumbosacral region: Secondary | ICD-10-CM | POA: Diagnosis not present

## 2016-12-05 DIAGNOSIS — M461 Sacroiliitis, not elsewhere classified: Secondary | ICD-10-CM | POA: Diagnosis not present

## 2016-12-05 DIAGNOSIS — M47816 Spondylosis without myelopathy or radiculopathy, lumbar region: Secondary | ICD-10-CM | POA: Diagnosis not present

## 2016-12-05 DIAGNOSIS — M545 Low back pain: Secondary | ICD-10-CM | POA: Diagnosis not present

## 2017-01-11 DIAGNOSIS — R102 Pelvic and perineal pain: Secondary | ICD-10-CM | POA: Diagnosis not present

## 2017-01-11 DIAGNOSIS — R1030 Lower abdominal pain, unspecified: Secondary | ICD-10-CM | POA: Diagnosis not present

## 2017-01-11 DIAGNOSIS — K4090 Unilateral inguinal hernia, without obstruction or gangrene, not specified as recurrent: Secondary | ICD-10-CM | POA: Diagnosis not present

## 2017-01-19 DIAGNOSIS — M47816 Spondylosis without myelopathy or radiculopathy, lumbar region: Secondary | ICD-10-CM | POA: Diagnosis not present

## 2017-01-19 DIAGNOSIS — M545 Low back pain: Secondary | ICD-10-CM | POA: Diagnosis not present

## 2017-02-06 DIAGNOSIS — M47816 Spondylosis without myelopathy or radiculopathy, lumbar region: Secondary | ICD-10-CM | POA: Diagnosis not present

## 2017-02-18 DIAGNOSIS — M25512 Pain in left shoulder: Secondary | ICD-10-CM | POA: Diagnosis not present

## 2017-02-22 DIAGNOSIS — Z1211 Encounter for screening for malignant neoplasm of colon: Secondary | ICD-10-CM | POA: Diagnosis not present

## 2017-03-13 DIAGNOSIS — G5701 Lesion of sciatic nerve, right lower limb: Secondary | ICD-10-CM | POA: Diagnosis not present

## 2017-03-24 DIAGNOSIS — M791 Myalgia, unspecified site: Secondary | ICD-10-CM | POA: Diagnosis not present

## 2017-03-24 DIAGNOSIS — M9905 Segmental and somatic dysfunction of pelvic region: Secondary | ICD-10-CM | POA: Diagnosis not present

## 2017-03-24 DIAGNOSIS — M9903 Segmental and somatic dysfunction of lumbar region: Secondary | ICD-10-CM | POA: Diagnosis not present

## 2017-03-24 DIAGNOSIS — M9902 Segmental and somatic dysfunction of thoracic region: Secondary | ICD-10-CM | POA: Diagnosis not present

## 2017-03-28 DIAGNOSIS — M9905 Segmental and somatic dysfunction of pelvic region: Secondary | ICD-10-CM | POA: Diagnosis not present

## 2017-03-28 DIAGNOSIS — M9902 Segmental and somatic dysfunction of thoracic region: Secondary | ICD-10-CM | POA: Diagnosis not present

## 2017-03-28 DIAGNOSIS — M791 Myalgia, unspecified site: Secondary | ICD-10-CM | POA: Diagnosis not present

## 2017-03-28 DIAGNOSIS — M9903 Segmental and somatic dysfunction of lumbar region: Secondary | ICD-10-CM | POA: Diagnosis not present

## 2017-04-05 DIAGNOSIS — M25512 Pain in left shoulder: Secondary | ICD-10-CM | POA: Diagnosis not present

## 2017-04-05 DIAGNOSIS — G5701 Lesion of sciatic nerve, right lower limb: Secondary | ICD-10-CM | POA: Diagnosis not present

## 2017-04-06 DIAGNOSIS — M9903 Segmental and somatic dysfunction of lumbar region: Secondary | ICD-10-CM | POA: Diagnosis not present

## 2017-04-06 DIAGNOSIS — M9902 Segmental and somatic dysfunction of thoracic region: Secondary | ICD-10-CM | POA: Diagnosis not present

## 2017-04-06 DIAGNOSIS — M791 Myalgia, unspecified site: Secondary | ICD-10-CM | POA: Diagnosis not present

## 2017-04-06 DIAGNOSIS — M9905 Segmental and somatic dysfunction of pelvic region: Secondary | ICD-10-CM | POA: Diagnosis not present

## 2017-05-04 DIAGNOSIS — M461 Sacroiliitis, not elsewhere classified: Secondary | ICD-10-CM | POA: Diagnosis not present

## 2017-05-04 DIAGNOSIS — M545 Low back pain: Secondary | ICD-10-CM | POA: Diagnosis not present

## 2017-05-11 ENCOUNTER — Ambulatory Visit: Payer: BLUE CROSS/BLUE SHIELD | Admitting: Family Medicine

## 2017-05-24 DIAGNOSIS — M5137 Other intervertebral disc degeneration, lumbosacral region: Secondary | ICD-10-CM | POA: Diagnosis not present

## 2017-05-24 DIAGNOSIS — M9903 Segmental and somatic dysfunction of lumbar region: Secondary | ICD-10-CM | POA: Diagnosis not present

## 2017-05-24 DIAGNOSIS — M9902 Segmental and somatic dysfunction of thoracic region: Secondary | ICD-10-CM | POA: Diagnosis not present

## 2017-05-24 DIAGNOSIS — M9904 Segmental and somatic dysfunction of sacral region: Secondary | ICD-10-CM | POA: Diagnosis not present

## 2017-05-25 DIAGNOSIS — M461 Sacroiliitis, not elsewhere classified: Secondary | ICD-10-CM | POA: Diagnosis not present

## 2017-06-07 DIAGNOSIS — M9902 Segmental and somatic dysfunction of thoracic region: Secondary | ICD-10-CM | POA: Diagnosis not present

## 2017-06-07 DIAGNOSIS — M5137 Other intervertebral disc degeneration, lumbosacral region: Secondary | ICD-10-CM | POA: Diagnosis not present

## 2017-06-07 DIAGNOSIS — M9903 Segmental and somatic dysfunction of lumbar region: Secondary | ICD-10-CM | POA: Diagnosis not present

## 2017-06-07 DIAGNOSIS — M9904 Segmental and somatic dysfunction of sacral region: Secondary | ICD-10-CM | POA: Diagnosis not present

## 2017-06-20 DIAGNOSIS — M545 Low back pain: Secondary | ICD-10-CM | POA: Diagnosis not present

## 2017-06-20 DIAGNOSIS — M6281 Muscle weakness (generalized): Secondary | ICD-10-CM | POA: Diagnosis not present

## 2017-06-23 DIAGNOSIS — G8921 Chronic pain due to trauma: Secondary | ICD-10-CM | POA: Diagnosis not present

## 2017-06-23 DIAGNOSIS — M545 Low back pain: Secondary | ICD-10-CM | POA: Diagnosis not present

## 2017-06-23 DIAGNOSIS — M6281 Muscle weakness (generalized): Secondary | ICD-10-CM | POA: Diagnosis not present

## 2017-07-03 DIAGNOSIS — G8921 Chronic pain due to trauma: Secondary | ICD-10-CM | POA: Diagnosis not present

## 2017-07-03 DIAGNOSIS — M545 Low back pain: Secondary | ICD-10-CM | POA: Diagnosis not present

## 2017-07-03 DIAGNOSIS — M6281 Muscle weakness (generalized): Secondary | ICD-10-CM | POA: Diagnosis not present

## 2017-07-06 DIAGNOSIS — M545 Low back pain: Secondary | ICD-10-CM | POA: Diagnosis not present

## 2017-07-06 DIAGNOSIS — G8921 Chronic pain due to trauma: Secondary | ICD-10-CM | POA: Diagnosis not present

## 2017-07-06 DIAGNOSIS — M6281 Muscle weakness (generalized): Secondary | ICD-10-CM | POA: Diagnosis not present

## 2017-07-10 DIAGNOSIS — M545 Low back pain: Secondary | ICD-10-CM | POA: Diagnosis not present

## 2017-07-10 DIAGNOSIS — M6281 Muscle weakness (generalized): Secondary | ICD-10-CM | POA: Diagnosis not present

## 2017-07-10 DIAGNOSIS — G8921 Chronic pain due to trauma: Secondary | ICD-10-CM | POA: Diagnosis not present

## 2017-07-11 DIAGNOSIS — M545 Low back pain: Secondary | ICD-10-CM | POA: Diagnosis not present

## 2017-07-11 DIAGNOSIS — M5431 Sciatica, right side: Secondary | ICD-10-CM | POA: Diagnosis not present

## 2017-07-13 DIAGNOSIS — G8921 Chronic pain due to trauma: Secondary | ICD-10-CM | POA: Diagnosis not present

## 2017-07-13 DIAGNOSIS — M545 Low back pain: Secondary | ICD-10-CM | POA: Diagnosis not present

## 2017-07-13 DIAGNOSIS — M6281 Muscle weakness (generalized): Secondary | ICD-10-CM | POA: Diagnosis not present

## 2017-07-18 DIAGNOSIS — G8921 Chronic pain due to trauma: Secondary | ICD-10-CM | POA: Diagnosis not present

## 2017-07-18 DIAGNOSIS — M545 Low back pain: Secondary | ICD-10-CM | POA: Diagnosis not present

## 2017-07-18 DIAGNOSIS — M6281 Muscle weakness (generalized): Secondary | ICD-10-CM | POA: Diagnosis not present

## 2017-07-25 DIAGNOSIS — M545 Low back pain: Secondary | ICD-10-CM | POA: Diagnosis not present

## 2017-09-14 DIAGNOSIS — Z79899 Other long term (current) drug therapy: Secondary | ICD-10-CM | POA: Diagnosis not present

## 2017-09-14 DIAGNOSIS — M545 Low back pain: Secondary | ICD-10-CM | POA: Diagnosis not present

## 2017-09-14 DIAGNOSIS — G5701 Lesion of sciatic nerve, right lower limb: Secondary | ICD-10-CM | POA: Diagnosis not present

## 2017-09-14 DIAGNOSIS — Z5181 Encounter for therapeutic drug level monitoring: Secondary | ICD-10-CM | POA: Diagnosis not present

## 2017-09-14 DIAGNOSIS — M7918 Myalgia, other site: Secondary | ICD-10-CM | POA: Diagnosis not present

## 2017-09-14 DIAGNOSIS — G8929 Other chronic pain: Secondary | ICD-10-CM | POA: Diagnosis not present

## 2017-09-27 DIAGNOSIS — M545 Low back pain: Secondary | ICD-10-CM | POA: Diagnosis not present

## 2017-09-27 DIAGNOSIS — M6281 Muscle weakness (generalized): Secondary | ICD-10-CM | POA: Diagnosis not present

## 2017-09-27 DIAGNOSIS — G894 Chronic pain syndrome: Secondary | ICD-10-CM | POA: Diagnosis not present

## 2017-10-04 DIAGNOSIS — M6281 Muscle weakness (generalized): Secondary | ICD-10-CM | POA: Diagnosis not present

## 2017-10-04 DIAGNOSIS — M545 Low back pain: Secondary | ICD-10-CM | POA: Diagnosis not present

## 2017-10-04 DIAGNOSIS — G894 Chronic pain syndrome: Secondary | ICD-10-CM | POA: Diagnosis not present

## 2017-10-11 DIAGNOSIS — G894 Chronic pain syndrome: Secondary | ICD-10-CM | POA: Diagnosis not present

## 2017-10-11 DIAGNOSIS — M545 Low back pain: Secondary | ICD-10-CM | POA: Diagnosis not present

## 2017-10-11 DIAGNOSIS — M6281 Muscle weakness (generalized): Secondary | ICD-10-CM | POA: Diagnosis not present

## 2017-11-08 ENCOUNTER — Ambulatory Visit (INDEPENDENT_AMBULATORY_CARE_PROVIDER_SITE_OTHER): Payer: BLUE CROSS/BLUE SHIELD | Admitting: Family Medicine

## 2017-11-08 ENCOUNTER — Encounter: Payer: Self-pay | Admitting: Family Medicine

## 2017-11-08 VITALS — BP 120/84 | HR 91 | Temp 98.6°F | Ht 72.0 in | Wt 197.4 lb

## 2017-11-08 DIAGNOSIS — Z23 Encounter for immunization: Secondary | ICD-10-CM

## 2017-11-08 DIAGNOSIS — Z0000 Encounter for general adult medical examination without abnormal findings: Secondary | ICD-10-CM

## 2017-11-08 MED ORDER — SUMATRIPTAN SUCCINATE 100 MG PO TABS: 100.0000 mg | ORAL_TABLET | ORAL | 2 refills | Status: DC | PRN

## 2017-11-08 NOTE — Progress Notes (Signed)
Subjective:     Patient ID: James Ballard, male   DOB: December 09, 1965, 52 y.o.   MRN: [id number]  HPI Patient seen for physical exam. Generally very healthy. He has had history of migraines usually about once a month and usually related to decreased sleep. He would like prescription for Imitrex which has worked for him in the past.  He's had some ongoing problems with piriformis syndrome and low back pain. He seen multiple specialists for this. Has been limited with exercise past few years. Intermittent lateral epicondylitis. Has played lots of tennis in the past.  Last tetanus 2012. No history of shingles vaccine. Declines flu vaccine today.  Has had some general fatigue issues and requests testosterone level with labs. He had colonoscopy last year  Past Medical History:  Diagnosis Date  . Hyperlipidemia    Past Surgical History:  Procedure Laterality Date  . HEMORROIDECTOMY    . KNEE ARTHROSCOPY     left  . sesmoid fx- excised    . ULNAR NERVE TRANSPOSITION      reports that he has never smoked. He has never used smokeless tobacco. He reports that he drinks alcohol. He reports that he does not use drugs. family history includes Heart disease in his father; Hyperlipidemia in his brother; Migraines in his brother and sister. Allergies  Allergen Reactions  . Doxycycline Hyclate     REACTION: rash       Review of Systems  Constitutional: Positive for fatigue. Negative for activity change, appetite change and fever.  HENT: Negative for congestion, ear pain and trouble swallowing.   Eyes: Negative for pain and visual disturbance.  Respiratory: Negative for cough, shortness of breath and wheezing.   Cardiovascular: Negative for chest pain and palpitations.  Gastrointestinal: Negative for abdominal distention, abdominal pain, blood in stool, constipation, diarrhea, nausea, rectal pain and vomiting.  Endocrine: Negative for polydipsia and polyuria.  Genitourinary: Negative for  dysuria, hematuria and testicular pain.  Musculoskeletal: Positive for back pain. Negative for arthralgias and joint swelling.  Skin: Negative for rash.  Neurological: Negative for dizziness, syncope and headaches.  Hematological: Negative for adenopathy.  Psychiatric/Behavioral: Negative for confusion and dysphoric mood.       Objective:   Physical Exam  Constitutional: He is oriented to person, place, and time. He appears well-developed and well-nourished. No distress.  HENT:  Head: Normocephalic and atraumatic.  Right Ear: External ear normal.  Left Ear: External ear normal.  Mouth/Throat: Oropharynx is clear and moist.  Eyes: Pupils are equal, round, and reactive to light. Conjunctivae and EOM are normal.  Neck: Normal range of motion. Neck supple. No thyromegaly present.  Cardiovascular: Normal rate, regular rhythm and normal heart sounds.  No murmur heard. Pulmonary/Chest: No respiratory distress. He has no wheezes. He has no rales.  Abdominal: Soft. Bowel sounds are normal. He exhibits no distension and no mass. There is no tenderness. There is no rebound and no guarding.  Musculoskeletal: He exhibits no edema.  Lymphadenopathy:    He has no cervical adenopathy.  Neurological: He is alert and oriented to person, place, and time. He displays normal reflexes. No cranial nerve deficit.  Skin: No rash noted.  Psychiatric: He has a normal mood and affect.       Assessment:     Physical exam. Patient history of infrequent migraine headaches and requesting tryptan medication. We discussed several health maintenance issues as below    Plan:     -shingles vaccine given and return in 2  months for booster -Flu vaccine offered and declined -Patient will return in a couple days for fasting labs including testosterone level per his request (we decided to get early AM labs secondary to his request for testosterone level). -Imitrex 100 mg as needed for migraine headache and may repeat  once in 2 hours as needed  Eulas Post MD [facility]

## 2017-11-08 NOTE — Addendum Note (Signed)
Addended by: Westley Hummer B on: 11/08/2017 05:43 PM   Modules accepted: Orders

## 2017-11-10 ENCOUNTER — Other Ambulatory Visit (INDEPENDENT_AMBULATORY_CARE_PROVIDER_SITE_OTHER): Payer: BLUE CROSS/BLUE SHIELD

## 2017-11-10 DIAGNOSIS — Z0000 Encounter for general adult medical examination without abnormal findings: Secondary | ICD-10-CM | POA: Diagnosis not present

## 2017-11-10 LAB — CBC WITH DIFFERENTIAL/PLATELET
Basophils Absolute: 0 10*3/uL (ref 0.0–0.1)
Basophils Relative: 0.5 % (ref 0.0–3.0)
Eosinophils Absolute: 0.1 10*3/uL (ref 0.0–0.7)
Eosinophils Relative: 2.4 % (ref 0.0–5.0)
HCT: 43 % (ref 39.0–52.0)
Hemoglobin: 15 g/dL (ref 13.0–17.0)
Lymphocytes Relative: 24.9 % (ref 12.0–46.0)
Lymphs Abs: 1 10*3/uL (ref 0.7–4.0)
MCHC: 34.9 g/dL (ref 30.0–36.0)
MCV: 81.7 fl (ref 78.0–100.0)
Monocytes Absolute: 0.6 10*3/uL (ref 0.1–1.0)
Monocytes Relative: 13.9 % — ABNORMAL HIGH (ref 3.0–12.0)
Neutro Abs: 2.4 10*3/uL (ref 1.4–7.7)
Neutrophils Relative %: 58.3 % (ref 43.0–77.0)
Platelets: 196 10*3/uL (ref 150.0–400.0)
RBC: 5.26 Mil/uL (ref 4.22–5.81)
RDW: 12.7 % (ref 11.5–15.5)
WBC: 4.2 10*3/uL (ref 4.0–10.5)

## 2017-11-10 LAB — TESTOSTERONE: Testosterone: 300.52 ng/dL (ref 300.00–890.00)

## 2017-11-10 LAB — PSA: PSA: 0.26 ng/mL (ref 0.10–4.00)

## 2017-11-10 LAB — BASIC METABOLIC PANEL
BUN: 15 mg/dL (ref 6–23)
CO2: 30 mEq/L (ref 19–32)
Calcium: 9.4 mg/dL (ref 8.4–10.5)
Chloride: 103 mEq/L (ref 96–112)
Creatinine, Ser: 1.02 mg/dL (ref 0.40–1.50)
GFR: 81.8 mL/min (ref >60.00)
Glucose, Bld: 95 mg/dL (ref 70–99)
Potassium: 4.2 mEq/L (ref 3.5–5.1)
Sodium: 141 mEq/L (ref 135–145)

## 2017-11-10 LAB — HEPATIC FUNCTION PANEL
ALT: 15 U/L (ref 0–53)
AST: 15 U/L (ref 0–37)
Albumin: 4.7 g/dL (ref 3.5–5.2)
Alkaline Phosphatase: 52 U/L (ref 39–117)
Bilirubin, Direct: 0.1 mg/dL (ref 0.0–0.3)
Total Bilirubin: 0.9 mg/dL (ref 0.2–1.2)
Total Protein: 7.2 g/dL (ref 6.0–8.3)

## 2017-11-10 LAB — LIPID PANEL
Cholesterol: 198 mg/dL (ref 0–200)
HDL: 49 mg/dL (ref >39.00)
LDL Cholesterol: 112 mg/dL — ABNORMAL HIGH (ref 0–99)
NonHDL: 149.14
Total CHOL/HDL Ratio: 4
Triglycerides: 186 mg/dL — ABNORMAL HIGH (ref 0.0–149.0)
VLDL: 37.2 mg/dL (ref 0.0–40.0)

## 2017-11-10 LAB — TSH: TSH: 1.28 u[IU]/mL (ref 0.35–4.50)

## 2017-12-06 ENCOUNTER — Other Ambulatory Visit: Payer: Self-pay

## 2017-12-06 ENCOUNTER — Telehealth: Payer: Self-pay | Admitting: Family Medicine

## 2017-12-06 DIAGNOSIS — M546 Pain in thoracic spine: Secondary | ICD-10-CM

## 2017-12-06 NOTE — Telephone Encounter (Signed)
Last OV 11/08/17, No future OV  Please advise.

## 2017-12-06 NOTE — Telephone Encounter (Signed)
I would suggest he see Gardenia Phlegm

## 2017-12-06 NOTE — Telephone Encounter (Signed)
Looks like pt has a history of back pain as noted in physical by Dr. Elease Hashimoto.

## 2017-12-06 NOTE — Telephone Encounter (Signed)
Called patient to let him know that the referral has been placed to sports medicine. Patient verbalized an understanding.

## 2017-12-06 NOTE — Telephone Encounter (Signed)
Copied from [place] [id number]. Topic: Quick Communication - See Telephone Encounter >> Dec 06, 2017 11:06 AM Marja Kays F wrote: Pt saw Dr. Elease Hashimoto  on 11/08/17 and is still having back pain and would like to know if Dr. Elease Hashimoto can referral him to a specialist  Best number [phone]

## 2017-12-07 DIAGNOSIS — G5701 Lesion of sciatic nerve, right lower limb: Secondary | ICD-10-CM | POA: Diagnosis not present

## 2017-12-25 NOTE — Progress Notes (Signed)
[name] [address], [place] [address] Phone: [phone] Subjective:    I'm seeing this patient by the request  of:  Eulas Post, MD   CC: Back pain  [id number]  James Ballard is a 52 y.o. male coming in with complaint of back pain.  2 years.  Right-sided.  Feels like it happened when he was seen by a chiropractor.  Had significant amount of pain for rotation and since then has virtually has not been without pain.  Has seen numerous providers for this over the course of time.  Has even seen pain management and has undergone 2 piriformis injections and epidural injection with no significant benefit.   Patient did have an MRI of the pelvis that did show a very small inguinal hernia but otherwise unremarkable for any type of bony changes to the sacroiliac joint or his back in general.  MRI of the lumbar spine also was unremarkable.  Past Medical History:  Diagnosis Date  . Hyperlipidemia    Past Surgical History:  Procedure Laterality Date  . HEMORROIDECTOMY    . KNEE ARTHROSCOPY     left  . sesmoid fx- excised    . ULNAR NERVE TRANSPOSITION     Social History   Socioeconomic History  . Marital status: Married    Spouse name: Not on file  . Number of children: Not on file  . Years of education: Not on file  . Highest education level: Not on file  Occupational History  . Not on file  Social Needs  . Financial resource strain: Not on file  . Food insecurity:    Worry: Not on file    Inability: Not on file  . Transportation needs:    Medical: Not on file    Non-medical: Not on file  Tobacco Use  . Smoking status: Never Smoker  . Smokeless tobacco: Never Used  Substance and Sexual Activity  . Alcohol use: Yes    Comment: occasionally  . Drug use: No  . Sexual activity: Not on file  Lifestyle  . Physical activity:    Days per week: Not on file    Minutes per session: Not on file  . Stress: Not on file    Relationships  . Social connections:    Talks on phone: Not on file    Gets together: Not on file    Attends religious service: Not on file    Active member of club or organization: Not on file    Attends meetings of clubs or organizations: Not on file    Relationship status: Not on file  Other Topics Concern  . Not on file  Social History Narrative  . Not on file   Allergies  Allergen Reactions  . Doxycycline Hyclate     REACTION: rash   Family History  Problem Relation Age of Onset  . Heart disease Father        pacemaker--has been removed-may have never needed it  . Migraines Sister   . Hyperlipidemia Brother   . Migraines Brother      Current Facility-Administered Medications (Endocrine & Metabolic):  .  betamethasone acetate-betamethasone sodium phosphate (CELESTONE) injection 3 mg .  betamethasone acetate-betamethasone sodium phosphate (CELESTONE) injection 3 mg      Current Outpatient Medications (Analgesics):  Marland Kitchen  SUMAtriptan (IMITREX) 100 MG tablet, Take 1 tablet (100 mg total) by mouth every 2 (two) hours as needed for migraine. May repeat in 2  hours if headache persists or recurs.     Current Outpatient Medications (Other):  Marland Kitchen  Vitamin D, Ergocalciferol, (DRISDOL) 50000 units CAPS capsule, Take 1 capsule (50,000 Units total) by mouth every 7 (seven) days.     Past medical history, social, surgical and family history all reviewed in electronic medical record.  No pertanent information unless stated regarding to the chief complaint.   Review of Systems:  No headache, visual changes, nausea, vomiting, diarrhea, constipation, dizziness, abdominal pain, skin rash, fevers, chills, night sweats, weight loss, swollen lymph nodes, body aches, joint swelling, muscle aches, chest pain, shortness of breath, mood changes.   Objective  Blood pressure 124/84, pulse 67, height 6\' 2"  (1.88 m), weight 196 lb (88.9 kg), SpO2 97 %.    General: No apparent distress  alert and oriented x3 mood and affect normal, dressed appropriately.  HEENT: Pupils equal, extraocular movements intact  Respiratory: Patient's speak in full sentences and does not appear short of breath  Cardiovascular: No lower extremity edema, non tender, no erythema  Skin: Warm dry intact with no signs of infection or rash on extremities or on axial skeleton.  Abdomen: Soft nontender  Neuro: Cranial nerves II through XII are intact, neurovascularly intact in all extremities with 2+ DTRs and 2+ pulses.  Lymph: No lymphadenopathy of posterior or anterior cervical chain or axillae bilaterally.  Gait normal with good balance and coordination.  MSK:  Non tender with full range of motion and good stability and symmetric strength and tone of shoulders, elbows, wrist, hip, knee and ankles bilaterally.  Back Exam:  Inspection: Unremarkable  Motion: Flexion 45 deg, Extension 20 deg, Side Bending to 35 deg bilaterally,  Rotation to 45 deg bilaterally  SLR laying: Negative  XSLR laying: Negative  Palpable tenderness: Tender to palpation the paraspinal musculature..  Seems to be more over the right sacroiliac joint and just above it is more lateral. FABER: Positive Corky Sox. Sensory change: Gross sensation intact to all lumbar and sacral dermatomes.  Reflexes: 2+ at both patellar tendons, 2+ at achilles tendons, Babinski's downgoing.  Strength at foot  Plantar-flexion: 5/5 Dorsi-flexion: 5/5 Eversion: 5/5 Inversion: 5/5  Leg strength  Quad: 5/5 Hamstring: 5/5 Hip flexor: 5/5 Hip abductors: 4/5 but symmetric Gait unremarkable.  Limited musculoskeletal ultrasound was performed and interpreted by Lyndal Pulley  Limited ultrasound of the MSK of patient's right sacrum showed no significant bony of normality but does seem to be offset.   Impression and Recommendations:     This case required medical decision making of moderate complexity. The above documentation has been reviewed and is accurate and  complete Lyndal Pulley, DO       Note: This dictation was prepared with Dragon dictation along with smaller phrase technology. Any transcriptional errors that result from this process are unintentional.

## 2017-12-26 ENCOUNTER — Encounter: Payer: Self-pay | Admitting: Family Medicine

## 2017-12-26 ENCOUNTER — Ambulatory Visit (INDEPENDENT_AMBULATORY_CARE_PROVIDER_SITE_OTHER)
Admission: RE | Admit: 2017-12-26 | Discharge: 2017-12-26 | Disposition: A | Discharge status: Home / Self Care | Payer: BLUE CROSS/BLUE SHIELD | Source: Ambulatory Visit | Attending: Family Medicine | Admitting: Family Medicine

## 2017-12-26 ENCOUNTER — Ambulatory Visit: Payer: Self-pay

## 2017-12-26 ENCOUNTER — Ambulatory Visit (INDEPENDENT_AMBULATORY_CARE_PROVIDER_SITE_OTHER): Payer: BLUE CROSS/BLUE SHIELD | Admitting: Family Medicine

## 2017-12-26 VITALS — BP 124/84 | HR 67 | Ht 74.0 in | Wt 196.0 lb

## 2017-12-26 DIAGNOSIS — M545 Low back pain, unspecified: Secondary | ICD-10-CM

## 2017-12-26 DIAGNOSIS — G8929 Other chronic pain: Secondary | ICD-10-CM | POA: Diagnosis not present

## 2017-12-26 DIAGNOSIS — R109 Unspecified abdominal pain: Secondary | ICD-10-CM | POA: Diagnosis not present

## 2017-12-26 MED ORDER — VITAMIN D (ERGOCALCIFEROL) 1.25 MG (50000 UNIT) PO CAPS: 50000.0000 [IU] | ORAL_CAPSULE | ORAL | 0 refills | Status: DC

## 2017-12-26 NOTE — Assessment & Plan Note (Signed)
Patient has pain that is very specific to the right SI joint.  Patient does have some very minimal pain over the incisional area and so I do think there is some possible radicular symptoms.  Started once weekly vitamin D because I am scared with patient having a potential stress reaction versus ligamentous injury.  Patient has had imaging previously that did not show any significant ins findings but I would like to get x-ray at this time to see what else could be contributing.  We may need to consider an MRI of the sacrum as well.  We discussed topical anti-inflammatories, once weekly vitamin D, over-the-counter medications and home exercise.  Follow-up again in 4 weeks

## 2017-12-26 NOTE — Patient Instructions (Signed)
Good to see you  Ice 20 minutes 2 times daily. Usually after activity and before bed. pennsaid pinkie amount topically 2 times daily as needed.  Once weekly vitamin D for 12 weeks We will get xray downstairs [facility] over the counter daily  See me again in 4 weeks

## 2018-01-23 NOTE — Progress Notes (Signed)
[name] [address], [place] [address] Phone: [phone] Subjective:      I James Ballard am serving as a Education administrator for Dr. Hulan Saas.   CC: Low back pain  IOE:[id number]  James Ballard is a 52 y.o. male coming in with complaint of low back pain. Back feels about the same.  Has done well but still always had pain patient states he has been doing the exercises intermittently.  Still able to work out.  Only has pain when he does a significant extension worsening.  Otherwise some pain off and on long.  Discussed icing regimen and home exercises which she has done only intermittently as well.   Patient's previous advanced imaging has only shown some inguinal hernia but nothing posteriorly.  This includes a pelvic MRI from January 12, 2017 patient is undergone epidurals and piriformis injections  Past Medical History:  Diagnosis Date  . Hyperlipidemia    Past Surgical History:  Procedure Laterality Date  . HEMORROIDECTOMY    . KNEE ARTHROSCOPY     left  . sesmoid fx- excised    . ULNAR NERVE TRANSPOSITION     Social History   Socioeconomic History  . Marital status: Married    Spouse name: Not on file  . Number of children: Not on file  . Years of education: Not on file  . Highest education level: Not on file  Occupational History  . Not on file  Social Needs  . Financial resource strain: Not on file  . Food insecurity:    Worry: Not on file    Inability: Not on file  . Transportation needs:    Medical: Not on file    Non-medical: Not on file  Tobacco Use  . Smoking status: Never Smoker  . Smokeless tobacco: Never Used  Substance and Sexual Activity  . Alcohol use: Yes    Comment: occasionally  . Drug use: No  . Sexual activity: Not on file  Lifestyle  . Physical activity:    Days per week: Not on file    Minutes per session: Not on file  . Stress: Not on file  Relationships  . Social connections:    Talks on  phone: Not on file    Gets together: Not on file    Attends religious service: Not on file    Active member of club or organization: Not on file    Attends meetings of clubs or organizations: Not on file    Relationship status: Not on file  Other Topics Concern  . Not on file  Social History Narrative  . Not on file   Allergies  Allergen Reactions  . Doxycycline Hyclate     REACTION: rash   Family History  Problem Relation Age of Onset  . Heart disease Father        pacemaker--has been removed-may have never needed it  . Migraines Sister   . Hyperlipidemia Brother   . Migraines Brother      Current Facility-Administered Medications (Endocrine & Metabolic):  .  betamethasone acetate-betamethasone sodium phosphate (CELESTONE) injection 3 mg .  betamethasone acetate-betamethasone sodium phosphate (CELESTONE) injection 3 mg      Current Outpatient Medications (Analgesics):  Marland Kitchen  SUMAtriptan (IMITREX) 100 MG tablet, Take 1 tablet (100 mg total) by mouth every 2 (two) hours as needed for migraine. May repeat in 2 hours if headache persists or recurs.     Current Outpatient Medications (Other):  .  Vitamin D, Ergocalciferol, (DRISDOL) 1.25 MG (50000 UT) CAPS capsule, Take 1 capsule (50,000 Units total) by mouth every 7 (seven) days.     Past medical history, social, surgical and family history all reviewed in electronic medical record.  No pertanent information unless stated regarding to the chief complaint.   Review of Systems:  No headache, visual changes, nausea, vomiting, diarrhea, constipation, dizziness, abdominal pain, skin rash, fevers, chills, night sweats, weight loss, swollen lymph nodes, body aches, joint swelling,  chest pain, shortness of breath, mood changes.  Positive muscle aches  Objective  Blood pressure 106/84, pulse 72, height 6\' 2"  (1.88 m), weight 189 lb (85.7 kg), SpO2 96 %.    General: No apparent distress alert and oriented x3 mood and affect  normal, dressed appropriately.  HEENT: Pupils equal, extraocular movements intact  Respiratory: Patient's speak in full sentences and does not appear short of breath  Cardiovascular: No lower extremity edema, non tender, no erythema  Skin: Warm dry intact with no signs of infection or rash on extremities or on axial skeleton.  Abdomen: Soft nontender  Neuro: Cranial nerves II through XII are intact, neurovascularly intact in all extremities with 2+ DTRs and 2+ pulses.  Lymph: No lymphadenopathy of posterior or anterior cervical chain or axillae bilaterally.  Gait normal with good balance and coordination.  MSK:  Non tender with full range of motion and good stability and symmetric strength and tone of shoulders, elbows, wrist, hip, knee and ankles bilaterally.  Back Exam:  Inspection: Loss of lordosis Motion: Flexion 45 deg, Extension 25 deg, Side Bending to 35 deg bilaterally,  Rotation to 45 deg bilaterally  SLR laying: Negative  XSLR laying: Negative  Palpable tenderness: Tender to palpation of the paraspinal musculature lumbar spine right greater than left mostly around the right sacroiliac joint. FABER: Positive right. Sensory change: Gross sensation intact to all lumbar and sacral dermatomes.  Reflexes: 2+ at both patellar tendons, 2+ at achilles tendons, Babinski's downgoing.  Strength at foot  Plantar-flexion: 5/5 Dorsi-flexion: 5/5 Eversion: 5/5 Inversion: 5/5  Leg strength  Quad: 5/5 Hamstring: 5/5 Hip flexor: 5/5 Hip abductors: 5/5  Gait unremarkable.    Impression and Recommendations:      The above documentation has been reviewed and is accurate and complete Lyndal Pulley, DO       Note: This dictation was prepared with Dragon dictation along with smaller phrase technology. Any transcriptional errors that result from this process are unintentional.

## 2018-01-24 ENCOUNTER — Ambulatory Visit (INDEPENDENT_AMBULATORY_CARE_PROVIDER_SITE_OTHER): Payer: BLUE CROSS/BLUE SHIELD | Admitting: Family Medicine

## 2018-01-24 ENCOUNTER — Encounter: Payer: Self-pay | Admitting: Family Medicine

## 2018-01-24 DIAGNOSIS — M533 Sacrococcygeal disorders, not elsewhere classified: Secondary | ICD-10-CM

## 2018-01-24 DIAGNOSIS — G8929 Other chronic pain: Secondary | ICD-10-CM

## 2018-01-24 MED ORDER — VITAMIN D (ERGOCALCIFEROL) 1.25 MG (50000 UNIT) PO CAPS: 50000.0000 [IU] | ORAL_CAPSULE | ORAL | 0 refills | Status: DC

## 2018-01-24 NOTE — Patient Instructions (Addendum)
Good to see you  Ice is your friend Try the vitamin D another 2 months Keep being active See em again in 4 weeks if we want to do the injection

## 2018-01-24 NOTE — Assessment & Plan Note (Signed)
Instability noted. Not responding to epidural of the back or piriformis.  Discussed the possibility of an injection in the right sacroiliac joint to see if this would cause any significant improvement in the pain.  Patient would like to continue with the vitamin D at this time.  We discussed the supplementation in great length.  Discussed icing regimen again.  Discussed the topical anti-inflammatories.

## 2018-02-21 ENCOUNTER — Ambulatory Visit: Payer: Self-pay

## 2018-02-21 ENCOUNTER — Encounter: Payer: Self-pay | Admitting: Family Medicine

## 2018-02-21 ENCOUNTER — Ambulatory Visit (INDEPENDENT_AMBULATORY_CARE_PROVIDER_SITE_OTHER): Payer: BLUE CROSS/BLUE SHIELD | Admitting: Family Medicine

## 2018-02-21 VITALS — BP 122/88 | HR 53 | Ht 74.0 in | Wt 198.0 lb

## 2018-02-21 DIAGNOSIS — M533 Sacrococcygeal disorders, not elsewhere classified: Secondary | ICD-10-CM | POA: Diagnosis not present

## 2018-02-21 DIAGNOSIS — M2021 Hallux rigidus, right foot: Secondary | ICD-10-CM | POA: Insufficient documentation

## 2018-02-21 DIAGNOSIS — G8929 Other chronic pain: Secondary | ICD-10-CM

## 2018-02-21 DIAGNOSIS — Z23 Encounter for immunization: Secondary | ICD-10-CM

## 2018-02-21 NOTE — Patient Instructions (Signed)
Injected right SI joint today  Really hope it helap  PRP handout read about it but hope we do not need to do it We will call you when orthotics are in Continue everything else See me again in 5-6 weeks

## 2018-02-21 NOTE — Progress Notes (Signed)
[name] [address] [place], [place] [address] Phone: [phone] Subjective:   Fontaine No, am serving as a scribe for Dr. Hulan Saas.    CC: Back pain follow-up  GYI:[id number]  James Ballard is a 52 y.o. male coming in with complaint of back pain. Said that his back has been aching since last visit. Has been taking K2 and Vitamin D but does not feel as if these supplements have provided any relief. Is frustrated as he has been having continued back pain for over 1 year.       Past Medical History:  Diagnosis Date  . Hyperlipidemia    Past Surgical History:  Procedure Laterality Date  . HEMORROIDECTOMY    . KNEE ARTHROSCOPY     left  . sesmoid fx- excised    . ULNAR NERVE TRANSPOSITION     Social History   Socioeconomic History  . Marital status: Married    Spouse name: Not on file  . Number of children: Not on file  . Years of education: Not on file  . Highest education level: Not on file  Occupational History  . Not on file  Social Needs  . Financial resource strain: Not on file  . Food insecurity:    Worry: Not on file    Inability: Not on file  . Transportation needs:    Medical: Not on file    Non-medical: Not on file  Tobacco Use  . Smoking status: Never Smoker  . Smokeless tobacco: Never Used  Substance and Sexual Activity  . Alcohol use: Yes    Comment: occasionally  . Drug use: No  . Sexual activity: Not on file  Lifestyle  . Physical activity:    Days per week: Not on file    Minutes per session: Not on file  . Stress: Not on file  Relationships  . Social connections:    Talks on phone: Not on file    Gets together: Not on file    Attends religious service: Not on file    Active member of club or organization: Not on file    Attends meetings of clubs or organizations: Not on file    Relationship status: Not on file  Other Topics Concern  . Not on file  Social History Narrative  . Not on file     Allergies  Allergen Reactions  . Doxycycline Hyclate     REACTION: rash   Family History  Problem Relation Age of Onset  . Heart disease Father        pacemaker--has been removed-may have never needed it  . Migraines Sister   . Hyperlipidemia Brother   . Migraines Brother      Current Facility-Administered Medications (Endocrine & Metabolic):  .  betamethasone acetate-betamethasone sodium phosphate (CELESTONE) injection 3 mg .  betamethasone acetate-betamethasone sodium phosphate (CELESTONE) injection 3 mg      Current Outpatient Medications (Analgesics):  Marland Kitchen  SUMAtriptan (IMITREX) 100 MG tablet, Take 1 tablet (100 mg total) by mouth every 2 (two) hours as needed for migraine. May repeat in 2 hours if headache persists or recurs.     Current Outpatient Medications (Other):  Marland Kitchen  Vitamin D, Ergocalciferol, (DRISDOL) 1.25 MG (50000 UT) CAPS capsule, Take 1 capsule (50,000 Units total) by mouth every 7 (seven) days.     Past medical history, social, surgical and family history all reviewed in electronic medical record.  No pertanent information unless stated regarding  to the chief complaint.   Review of Systems:  No headache, visual changes, nausea, vomiting, diarrhea, constipation, dizziness, abdominal pain, skin rash, fevers, chills, night sweats, weight loss, swollen lymph nodes, body aches, joint swelling, muscle aches, chest pain, shortness of breath, mood changes.   Objective  Blood pressure 122/88, pulse (!) 53, height 6\' 2"  (1.88 m), weight 198 lb (89.8 kg), SpO2 99 %.    General: No apparent distress alert and oriented x3 mood and affect normal, dressed appropriately.  HEENT: Pupils equal, extraocular movements intact  Respiratory: Patient's speak in full sentences and does not appear short of breath  Cardiovascular: No lower extremity edema, non tender, no erythema  Skin: Warm dry intact with no signs of infection or rash on extremities or on axial skeleton.   Abdomen: Soft nontender  Neuro: Cranial nerves II through XII are intact, neurovascularly intact in all extremities with 2+ DTRs and 2+ pulses.  Lymph: No lymphadenopathy of posterior or anterior cervical chain or axillae bilaterally.  Gait normal with good balance and coordination.  MSK:  Non tender with full range of motion and good stability and symmetric strength and tone of shoulders, elbows, wrist, hip, knee and ankles bilaterally.   Back exam shows the patient has very mild loss of lordosis.  Severe tenderness over the right sacroiliac joint.  Positive Faber test.  4+ out of 5 strength with every movement on the right side.  Some mild weakness noted on the hip abductors.  Procedure: Real-time Ultrasound Guided Injection of right SI joint Device: GE Logiq Q7 Ultrasound guided injection is preferred based studies that show increased duration, increased effect, greater accuracy, decreased procedural pain, increased response rate, and decreased cost with ultrasound guided versus blind injection.  Verbal informed consent obtained.  Time-out conducted.  Noted no overlying erythema, induration, or other signs of local infection.  Skin prepped in a sterile fashion.  Local anesthesia: Topical Ethyl chloride.  With sterile technique and under real time ultrasound guidance: With a 21-gauge 2 inch needle patient was injected with 0.5 cc of 0.5% Marcaine and 1 cc of Kenalog 40 mg/mL into the right sacroiliac joint Completed without difficulty  Pain immediately resolved suggesting accurate placement of the medication.  Advised to call if fevers/chills, erythema, induration, drainage, or persistent bleeding.  Images permanently stored and available for review in the ultrasound unit.  Impression: Technically successful ultrasound guided injection.    Impression and Recommendations:     This case required medical decision making of moderate complexity. The above documentation has been reviewed  and is accurate and complete Lyndal Pulley, DO       Note: This dictation was prepared with Dragon dictation along with smaller phrase technology. Any transcriptional errors that result from this process are unintentional.

## 2018-02-21 NOTE — Assessment & Plan Note (Signed)
Hallux rigidus noted.  Discussed icing regimen and home exercise.  Discussed possible more rigid size shoes.  With patient also having this sesamoidectomy I do feel that custom orthotics will be beneficial patient be set up in the near future.  Follow-up again after the orthotics

## 2018-02-21 NOTE — Assessment & Plan Note (Signed)
Patient given injection today.  Was feeling better at the time of the injection and walking up.  We discussed with patient at great length.  Discussed icing regimen and home exercises.  Discussed continuing all other conservative therapy.  Discussed the possibility of PRP injections if needed.  Continue the vitamin D.  Follow-up with me again 4 to 6 weeks

## 2018-03-05 ENCOUNTER — Telehealth: Payer: Self-pay | Admitting: Family Medicine

## 2018-03-05 NOTE — Telephone Encounter (Signed)
Copied from [place] [id number]. Topic: Quick Communication - See Telephone Encounter >> Mar 05, 2018  3:20 PM Rutherford Nail, [place] wrote: CRM for notification. See Telephone encounter for: 03/05/18. Patient calling to check the status of the orthotics that Dr Tamala Julian was going to order for him. Please advise.

## 2018-03-12 ENCOUNTER — Telehealth: Payer: Self-pay

## 2018-03-12 NOTE — Telephone Encounter (Signed)
Returned patient's call concerning orthotics. Scheduled 1/9 at 2pm.

## 2018-03-14 NOTE — Progress Notes (Signed)
   Procedure Note   Patient was fitted for a : standard, cushioned, semi-rigid orthotic. The orthotic was heated and afterward the patient patient seated position and molded The patient was positioned in subtalar neutral position and 10 degrees of ankle dorsiflexion in a weight bearing stance. After completion of molding, patient did have orthotic management The blank was ground to a stable position for weight bearing. Size:11 Base: Carbon fiber Additional Posting and Padding: left medial: 300/140 300/120 250/60 Right: 300/140 300/120 The patient ambulated these, and they were very comfortable.

## 2018-03-14 NOTE — Assessment & Plan Note (Signed)
Significant hallux rigidus noted.  Does have some breakdown of the longitudinal and transverse arch.  Discussed increasing wear over the course of neck several weeks.  Discussed the changes may be necessary in the first 2 weeks.  Follow-up with me again in 4 to 6 weeks

## 2018-03-15 ENCOUNTER — Ambulatory Visit (INDEPENDENT_AMBULATORY_CARE_PROVIDER_SITE_OTHER): Payer: BLUE CROSS/BLUE SHIELD | Admitting: Family Medicine

## 2018-03-15 DIAGNOSIS — M2021 Hallux rigidus, right foot: Secondary | ICD-10-CM | POA: Diagnosis not present

## 2018-03-16 ENCOUNTER — Encounter: Payer: Self-pay | Admitting: Internal Medicine

## 2018-03-16 ENCOUNTER — Ambulatory Visit (INDEPENDENT_AMBULATORY_CARE_PROVIDER_SITE_OTHER): Payer: BLUE CROSS/BLUE SHIELD | Admitting: Internal Medicine

## 2018-03-16 VITALS — BP 128/86 | HR 76 | Temp 99.0°F | Wt 191.5 lb

## 2018-03-16 DIAGNOSIS — R42 Dizziness and giddiness: Secondary | ICD-10-CM | POA: Diagnosis not present

## 2018-03-16 NOTE — Patient Instructions (Signed)
referring you for  Neuro rehab that does therapy for vertigo .   I dont see infection   But if having congestion can try claritin.   No ladders   Dangerous position until lbetter     Benign Positional Vertigo Vertigo is the feeling that you or your surroundings are moving when they are not. Benign positional vertigo is the most common form of vertigo. This is usually a harmless condition (benign). This condition is positional. This means that symptoms are triggered by certain movements and positions. This condition can be dangerous if it occurs while you are doing something that could cause harm to you or others. This includes activities such as driving or operating machinery. What are the causes? In many cases, the cause of this condition is not known. It may be caused by a disturbance in an area of the inner ear that helps your brain to sense movement and balance. This disturbance can be caused by:  Viral infection (labyrinthitis).  Head injury.  Repetitive motion, such as jumping, dancing, or running. What increases the risk? You are more likely to develop this condition if:  You are a woman.  You are 52 years of age or older. What are the signs or symptoms? Symptoms of this condition usually happen when you move your head or your eyes in different directions. Symptoms may start suddenly, and usually last for less than a minute. They include:  Loss of balance and falling.  Feeling like you are spinning or moving.  Feeling like your surroundings are spinning or moving.  Nausea and vomiting.  Blurred vision.  Dizziness.  Involuntary eye movement (nystagmus). Symptoms can be mild and cause only minor problems, or they can be severe and interfere with daily life. Episodes of benign positional vertigo may return (recur) over time. Symptoms may improve over time. How is this diagnosed? This condition may be diagnosed based on:  Your medical history.  Physical exam of the  head, neck, and ears.  Tests, such as: ? MRI. ? CT scan. ? Eye movement tests. Your health care provider may ask you to change positions quickly while he or she watches you for symptoms of benign positional vertigo, such as nystagmus. Eye movement may be tested with a variety of exams that are designed to evaluate or stimulate vertigo. ? An electroencephalogram (EEG). This records electrical activity in your brain. ? Hearing tests. You may be referred to a health care provider who specializes in ear, nose, and throat (ENT) problems (otolaryngologist) or a provider who specializes in disorders of the nervous system (neurologist). How is this treated?  This condition may be treated in a session in which your health care provider moves your head in specific positions to adjust your inner ear back to normal. Treatment for this condition may take several sessions. Surgery may be needed in severe cases, but this is rare. In some cases, benign positional vertigo may resolve on its own in 2-4 weeks. Follow these instructions at home: Safety  Move slowly. Avoid sudden body or head movements or certain positions, as told by your health care provider.  Avoid driving until your health care provider says it is safe for you to do so.  Avoid operating heavy machinery until your health care provider says it is safe for you to do so.  Avoid doing any tasks that would be dangerous to you or others if vertigo occurs.  If you have trouble walking or keeping your balance, try using a cane  for stability. If you feel dizzy or unstable, sit down right away.  Return to your normal activities as told by your health care provider. Ask your health care provider what activities are safe for you. General instructions  Take over-the-counter and prescription medicines only as told by your health care provider.  Drink enough fluid to keep your urine pale yellow.  Keep all follow-up visits as told by your health  care provider. This is important. Contact a health care provider if:  You have a fever.  Your condition gets worse or you develop new symptoms.  Your family or friends notice any behavioral changes.  You have nausea or vomiting that gets worse.  You have numbness or a "pins and needles" sensation. Get help right away if you:  Have difficulty speaking or moving.  Are always dizzy.  Faint.  Develop severe headaches.  Have weakness in your legs or arms.  Have changes in your hearing or vision.  Develop a stiff neck.  Develop sensitivity to light. Summary  Vertigo is the feeling that you or your surroundings are moving when they are not. Benign positional vertigo is the most common form of vertigo.  The cause of this condition is not known. It may be caused by a disturbance in an area of the inner ear that helps your brain to sense movement and balance.  Symptoms include loss of balance and falling, feeling that you or your surroundings are moving, nausea and vomiting, and blurred vision.  This condition can be diagnosed based on symptoms, physical exam, and other tests, such as MRI, CT scan, eye movement tests, and hearing tests.  Follow safety instructions as told by your health care provider. You will also be told when to contact your health care provider in case of problems. This information is not intended to replace advice given to you by your health care provider. Make sure you discuss any questions you have with your health care provider. Document Released: 11/29/2005 Document Revised: 08/02/2017 Document Reviewed: 08/02/2017 Elsevier Interactive Patient Education  2019 [employer].

## 2018-03-16 NOTE — Progress Notes (Signed)
Acute Office Visit  Subjective:    Patient ID: James Ballard, male    DOB: 07-Jan-1966, 53 y.o.   MRN: [id number]  Chief Complaint  Patient presents with  . Ear Pain    Left ear. pt states that he has been unbalanced and dizzy     HPI Patient is in today for ear pain sda  PCP NA  Slept and when got up got severe vertigo. But ever since then had dizziness   To left.   Acts like bppv.   No difference.  Feels stopping and not clearing  .     Doing home maneuveurs and not working   Had crushing.     migraine.  last week  Hx of same took zomig  .  No fever  Hearing loss or sounds  No hx of same   The the next ? Day or when arose had this onset.  No hearing gloss real pain mfever  Feels like left ear congested but no cold    Past Medical History:  Diagnosis Date  . Hyperlipidemia     Past Surgical History:  Procedure Laterality Date  . HEMORROIDECTOMY    . KNEE ARTHROSCOPY     left  . sesmoid fx- excised    . ULNAR NERVE TRANSPOSITION      Family History  Problem Relation Age of Onset  . Heart disease Father        pacemaker--has been removed-may have never needed it  . Migraines Sister   . Hyperlipidemia Brother   . Migraines Brother     Social History   Socioeconomic History  . Marital status: Married    Spouse name: Not on file  . Number of children: Not on file  . Years of education: Not on file  . Highest education level: Not on file  Occupational History  . Not on file  Social Needs  . Financial resource strain: Not on file  . Food insecurity:    Worry: Not on file    Inability: Not on file  . Transportation needs:    Medical: Not on file    Non-medical: Not on file  Tobacco Use  . Smoking status: Never Smoker  . Smokeless tobacco: Never Used  Substance and Sexual Activity  . Alcohol use: Yes    Comment: occasionally  . Drug use: No  . Sexual activity: Not on file  Lifestyle  . Physical activity:    Days per week: Not on file    Minutes per  session: Not on file  . Stress: Not on file  Relationships  . Social connections:    Talks on phone: Not on file    Gets together: Not on file    Attends religious service: Not on file    Active member of club or organization: Not on file    Attends meetings of clubs or organizations: Not on file    Relationship status: Not on file  . Intimate partner violence:    Fear of current or ex partner: Not on file    Emotionally abused: Not on file    Physically abused: Not on file    Forced sexual activity: Not on file  Other Topics Concern  . Not on file  Social History Narrative  . Not on file    Outpatient Medications Prior to Visit  Medication Sig Dispense Refill  . SUMAtriptan (IMITREX) 100 MG tablet Take 1 tablet (100 mg total) by mouth every 2 (two)  hours as needed for migraine. May repeat in 2 hours if headache persists or recurs. 10 tablet 2  . Vitamin D, Ergocalciferol, (DRISDOL) 1.25 MG (50000 UT) CAPS capsule Take 1 capsule (50,000 Units total) by mouth every 7 (seven) days. (Patient not taking: Reported on 03/16/2018) 12 capsule 0   Facility-Administered Medications Prior to Visit  Medication Dose Route Frequency Provider Last Rate Last Dose  . betamethasone acetate-betamethasone sodium phosphate (CELESTONE) injection 3 mg  3 mg Intramuscular Once Daylene Katayama M, DPM      . betamethasone acetate-betamethasone sodium phosphate (CELESTONE) injection 3 mg  3 mg Intramuscular Once Edrick Kins, DPM        Allergies  Allergen Reactions  . Doxycycline Hyclate     REACTION: rash    ROS See hpi     Objective:    Physical Exam  Physical Exam: Vital signs reviewed TIR:WERX is a well-developed well-nourished alert cooperative  White male  who appears   stated age in no acute distress.  HEENT: normocephalic  traumatic , Eyes: PERRL EOM's full,  lataeral nust dizzy when laying down and to the left conjunctiva clear, Nares: patent no deformity discharge or tenderness., Ears:  no deformity EAC's clear TMs with normal landmarks. Mouth: clear OP, no lesions, edema.  Moist mucous membranes. Dentition in adequate repair. NECK: supple without masses, thyromegaly or bruits. CHEST/PULM:  Clear to auscultation and percussion breath sounds equal no wheeze , rales or rhonchi.  CV: PMI is nondisplaced, S1 S2 no gallops, murmurs, rubs. Peripheral pulses arewithout delay.No JVD .  ABDOMEN: Bowel sounds normal nontender  No guard or rebound, no hepato splenomegal no CVA tenderness.   Extremtities:  No clubbing cyanosis or edema, no acute joint swelling or redness no focal atrophy NEURO:  Oriented x3, cranial nerves 3-12 appear to be intact, no obvious focal weakness,gait within normal limits no abnormal reflexes or asymmetrical neg romberg  fto n and nl heel toe walk  Non focal exam  hearingnot tested  SKIN: No acute rashes normal turgor, color, no bruising or petechiae. PSYCH: Oriented, good eye contact,, cognition and judgment appear normal. LN:  No cervical  adenopathy         BP 128/86 (BP Location: Right Arm, Patient Position: Sitting, Cuff Size: Normal)   Pulse 76   Temp 99 F (37.2 C) (Oral)   Wt 191 lb 8 oz (86.9 kg)   BMI 24.59 kg/m  Wt Readings from Last 3 Encounters:  03/16/18 191 lb 8 oz (86.9 kg)  02/21/18 198 lb (89.8 kg)  01/24/18 189 lb (85.7 kg)    Health Maintenance Due  Topic Date Due  . HIV Screening  09/27/1981    There are no preventive care reminders to display for this patient.   Lab Results  Component Value Date   TSH 1.28 11/10/2017   Lab Results  Component Value Date   WBC 4.2 11/10/2017   HGB 15.0 11/10/2017   HCT 43.0 11/10/2017   MCV 81.7 11/10/2017   PLT 196.0 11/10/2017   Lab Results  Component Value Date   NA 141 11/10/2017   K 4.2 11/10/2017   CO2 30 11/10/2017   GLUCOSE 95 11/10/2017   BUN 15 11/10/2017   CREATININE 1.02 11/10/2017   BILITOT 0.9 11/10/2017   ALKPHOS 52 11/10/2017   AST 15 11/10/2017   ALT 15  11/10/2017   PROT 7.2 11/10/2017   ALBUMIN 4.7 11/10/2017   CALCIUM 9.4 11/10/2017   ANIONGAP 11 11/18/2014  GFR 81.80 11/10/2017   Lab Results  Component Value Date   CHOL 198 11/10/2017   Lab Results  Component Value Date   HDL 49.00 11/10/2017   Lab Results  Component Value Date   LDLCALC 112 (H) 11/10/2017   Lab Results  Component Value Date   TRIG 186.0 (H) 11/10/2017   Lab Results  Component Value Date   CHOLHDL 4 11/10/2017   Lab Results  Component Value Date   HGBA1C 5.8 02/13/2012       Assessment & Plan:   Problem List Items Addressed This Visit    None    Visit Diagnoses    Vertigo    -  Primary   Relevant Orders   Referral to Neuro Rehab     Suspect peripheral vertigo and exam reassuring non focal  Good balance  Failed his  Home maneuvers    Expectant management. And options  disc  Fall avoidance  Plan fu with pcp  If not helpful.   No orders of the defined types were placed in this encounter.    Shanon Ace, MD

## 2018-03-19 ENCOUNTER — Other Ambulatory Visit: Payer: Self-pay

## 2018-03-19 ENCOUNTER — Ambulatory Visit: Payer: BLUE CROSS/BLUE SHIELD | Attending: Internal Medicine | Admitting: Physical Therapy

## 2018-03-19 DIAGNOSIS — H8113 Benign paroxysmal vertigo, bilateral: Secondary | ICD-10-CM | POA: Diagnosis not present

## 2018-03-19 DIAGNOSIS — R2681 Unsteadiness on feet: Secondary | ICD-10-CM

## 2018-03-19 DIAGNOSIS — R42 Dizziness and giddiness: Secondary | ICD-10-CM | POA: Insufficient documentation

## 2018-03-19 NOTE — Patient Instructions (Signed)
  Given [facility] handout

## 2018-03-19 NOTE — Therapy (Signed)
[facility] [address] [place], [place], [address] Phone: [phone]   Fax:  [phone]  Physical Therapy Evaluation  Patient Details  Name: James Ballard MRN: [id number] Date of Birth: 28-Oct-1965 Referring Provider (PT): Panosh, Standley Brooking, MD   Encounter Date: 03/19/2018  PT End of Session - 03/19/18 1353    Visit Number  1    Number of Visits  9    Date for PT Re-Evaluation  04/18/18    Authorization Type  BCBS (no other insurance info on eval)    PT Start Time  [phone]    PT Stop Time  0932    PT Time Calculation (min)  41 min    Activity Tolerance  Treatment limited secondary to medical complications (Comment)   required rest breaks between maneuvers due to "woozy"   Behavior During Therapy  [facility] for tasks assessed/performed       Past Medical History:  Diagnosis Date  . Hyperlipidemia     Past Surgical History:  Procedure Laterality Date  . HEMORROIDECTOMY    . KNEE ARTHROSCOPY     left  . sesmoid fx- excised    . ULNAR NERVE TRANSPOSITION      There were no vitals filed for this visit.   Subjective Assessment - 03/19/18 0851    Subjective  Had the worst migraine he's had a week ago. took meds and went to sleep. Woke up and was spinning and fell back on the bed. Lasted since then. If turns head to the left world flips. Has been doing Epley he was given with slight improvement, but certain motions still gets very dizzy.    Pertinent History  HLD, ulnar n transposition, recent LBP with SI injection    Currently in Pain?  No/denies         [facility] PT Assessment - 03/19/18 1344      Assessment   Medical Diagnosis   Vertigo    Referring Provider (PT)  Panosh, Standley Brooking, MD    Onset Date/Surgical Date  --   MD referral 03/16/18   Prior Therapy  none; MD gave Epley instructions      Precautions   Precautions  None      Balance Screen   Has the patient fallen in the past 6 months  No      [place] residence    Living Arrangements  Spouse/significant other;Children    Additional Comments  in the process of moving      Prior Function   Level of [place] employed;Full time employment    Building control surveyor    Leisure  likes to be active (tennis, running, cycling, etc) however has been very limited by low back/SI joint pain since March 2018      Cognition   Overall Cognitive Status  Within Functional Limits for tasks assessed      Observation/Other Assessments   Focus on Therapeutic Outcomes (FOTO)   not set up           Vestibular Assessment - 03/19/18 0906      Vestibular Assessment   General Observation  spinning      Symptom Behavior   Type of Dizziness  Spinning    Frequency of Dizziness  daily    Duration of Dizziness  seconds    Aggravating Factors  Supine to sit;Forward bending;Looking up to  the ceiling    Relieving Factors  Slow movements;Head stationary      Positional Testing   Dix-Hallpike  Dix-Hallpike Left      Dix-Hallpike Left   Dix-Hallpike Left Duration  10   8*immediate onset, 5, 4   Dix-Hallpike Left Symptoms  Upbeat, left rotatory nystagmus          Objective measurements completed on examination: See above findings.       Vestibular Treatment/Exercise - 03/19/18 0907      Vestibular Treatment/Exercise   Vestibular Treatment Provided  Canalith Repositioning    Canalith Repositioning  Epley Manuever Left;Semont Procedure Left Posterior    Habituation Exercises  Brandt Daroff       EPLEY MANUEVER LEFT   Number of Reps   3    Overall Response   Improved Symptoms     RESPONSE DETAILS LEFT  each time symptoms lessened but still present;       Semont Procedure Left Posterior   Number of Reps   1    Overall Response  Improved Symptoms    Response Details   Dix Hallpike symptoms with latent period after Liberatory maneuver      Nestor Lewandowsky   Symptom Description   educated pt and provided handout; did not have time to practice            PT Education - 03/19/18 1351    Education Details  results of eval; what is BPPV; some symptoms more consistent with ?neuritis, however has +nystagmus ; educated in [place] for HEP    Person(s) Educated  Patient    Methods  Explanation;Demonstration;Handout    Comprehension  Verbalized understanding;Returned demonstration;Need further instruction          PT Long Term Goals - 03/19/18 1555      PT LONG TERM GOAL #1   Title  Patient will have negative positional testing for BPPV (Target all LTGs 04/18/2018)    Time  4    Period  Weeks    Status  New    Target Date  04/18/18      PT LONG TERM GOAL #2   Title  Patient will be able to verbalize options for obtaining a PT referral should he have BPPV again and need assistance.     Time  4    Period  Weeks    Status  New      PT LONG TERM GOAL #3   Title  Once BPPV cleared, patient will test negative for hypofunction. (If does not test negative, will update goals to address hypofunction)    Time  4    Period  Weeks    Status  New             Plan - 03/19/18 1357    Clinical Impression Statement  Patient referred for OPPT due to acute onset of vertigo. Patient was diagnosed by MD with BPPV and had been doing Epley maneuvers himself with only some improvement. Patient's symptoms and nystagmus consistent with left posterior canalithiasis vs cupulolithiasis (one rep of Dix-Hallpike he had no latency prior to onset of nystagmus). Despite 1 rep of Liberatory maneuver and 3 reps of Epley, pt still had short burst of nystagmus on Dix-Hallpike. Patient educated on Brandt-Daroff for HEP and to try head-shaking prior to Epley maneuver. Patient can benefit from continued OPPT due to persistent left posterior BPPV causing dizziness and imbalance. Hopefully will only need 1 more session, however concern for hypofunction due  to  symptoms of left ear fullness and respiratory symptoms prior to onset of vertigo. Patient will benefit from the interventions listed below.     History and Personal Factors relevant to plan of care:  PMH-HLD, ulnar n transposition, recent LBP with SI injection    Clinical Presentation  Evolving    Clinical Presentation due to:  persistent vertigo with imbalance/dizziness    Clinical Decision Making  Low    Rehab Potential  Good    PT Frequency  2x / week    PT Duration  4 weeks    PT Treatment/Interventions  ADLs/Self Care Home Management;Canalith Repostioning;Therapeutic activities;Therapeutic exercise;Balance training;Patient/family education;Neuromuscular re-education;Vestibular;Visual/perceptual remediation/compensation    PT Next Visit Plan  double-check insurance for VL and copays; how often did he do Brandt-Daroff &/or Epley??; assess Lt Dix-Hallpike--if +, try head-shaking or vibrator then Epley; if negative assess for hypofunction (NOTE: plan written for 4 weeks, pt only scheduled for 2 weeks)   Consulted and Agree with Plan of Care  Patient       Patient will benefit from skilled therapeutic intervention in order to improve the following deficits and impairments:  Decreased activity tolerance, Decreased balance, Decreased knowledge of precautions, Difficulty walking, Dizziness, Impaired vision/preception  Visit Diagnosis: BPPV (benign paroxysmal positional vertigo), bilateral  Dizziness and giddiness  Unsteadiness on feet     Problem List Patient Active Problem List   Diagnosis Date Noted  . Hallux rigidus of right foot 02/21/2018  . Chronic right SI joint pain 01/24/2018  . Right sided abdominal pain 12/26/2017  . Cough 05/02/2011  . Acute back pain 05/02/2011  . INGUINAL PAIN, RIGHT 03/24/2010  . SHOULDER PAIN, LEFT 07/02/2009  . KNEE PAIN, RIGHT 05/04/2009  . BACK PAIN, THORACIC REGION, RIGHT 10/15/2008  . ITBS, RIGHT KNEE 10/15/2008  . HYPERLIPIDEMIA 08/07/2008     Rexanne Mano, PT 03/19/2018, 3:59 PM  [facility] [address] [place], [place], [address] Phone: [phone]   Fax:  [phone]  Name: DARUS HERSHMAN MRN: [id number] Date of Birth: May 23, 1965

## 2018-03-22 ENCOUNTER — Encounter: Payer: Self-pay | Admitting: Physical Therapy

## 2018-03-22 ENCOUNTER — Ambulatory Visit: Payer: BLUE CROSS/BLUE SHIELD | Admitting: Physical Therapy

## 2018-03-22 DIAGNOSIS — R2681 Unsteadiness on feet: Secondary | ICD-10-CM

## 2018-03-22 DIAGNOSIS — H8113 Benign paroxysmal vertigo, bilateral: Secondary | ICD-10-CM | POA: Diagnosis not present

## 2018-03-22 DIAGNOSIS — R42 Dizziness and giddiness: Secondary | ICD-10-CM

## 2018-03-22 NOTE — Therapy (Signed)
[facility] [address] [place], [place], [address] Phone: [phone]   Fax:  [phone]  Physical Therapy Treatment  Patient Details  Name: James Ballard MRN: [id number] Date of Birth: Sep 05, 1965 Referring Provider (PT): Panosh, Standley Brooking, MD   Encounter Date: 03/22/2018  PT End of Session - 03/22/18 1646    Visit Number  2    Number of Visits  9    Date for PT Re-Evaluation  04/18/18    Authorization Type  BCBS; $5000 family deductible, 10% coinsurance: VL: pt, ot, hydro 100 days, zero used    Authorization - Visit Number  2    Authorization - Number of Visits  100    PT Start Time  1535    PT Stop Time  1620    PT Time Calculation (min)  45 min    Activity Tolerance  Patient tolerated treatment well    Behavior During Therapy  [facility] for tasks assessed/performed       Past Medical History:  Diagnosis Date  . Hyperlipidemia     Past Surgical History:  Procedure Laterality Date  . HEMORROIDECTOMY    . KNEE ARTHROSCOPY     left  . sesmoid fx- excised    . ULNAR NERVE TRANSPOSITION      There were no vitals filed for this visit.  Subjective Assessment - 03/22/18 1541    Subjective  States no improvement since last here. Has been doing Brandt-Daroff 5 reps, 3 times per day. Epley also. Still has symptoms when goes left, but not to right.     Pertinent History  HLD, ulnar n transposition, recent LBP with SI injection    Patient Stated Goals  stop spinning     Currently in Pain?  No/denies             Vestibular Assessment - 03/22/18 1628      Vestibular Assessment   General Observation  spinning      Symptom Behavior   Type of Dizziness  Spinning    Frequency of Dizziness  daily    Duration of Dizziness  seconds    Aggravating Factors  Sit to stand;Supine to sit    Relieving Factors  Slow movements;Head stationary      Occulomotor Exam   Occulomotor Alignment  Normal    Head shaking  Horizontal  Absent      Vestibulo-Occular Reflex   VOR Cancellation  Normal    Comment  HIT + to left      Positional Testing   Dix-Hallpike  Dix-Hallpike Left      Dix-Hallpike Left   Dix-Hallpike Left Duration  8   2 second delay   Dix-Hallpike Left Symptoms  Upbeat, left rotatory nystagmus                Vestibular Treatment/Exercise - 03/22/18 1633      Vestibular Treatment/Exercise   Vestibular Treatment Provided  Canalith Repositioning    Canalith Repositioning  Epley Manuever Left       EPLEY MANUEVER LEFT   Number of Reps   3    Overall Response   Symptoms Resolved     RESPONSE DETAILS LEFT  2nd rep with greater delay to nystagmus; 3rd rep no symptoms        Self care- see Education details; also educated on BPPV, inner ear anatomy (as pt concerned re: left ear "popping" constantly and educated on difference between eustachian tube, eardrum and semicircular canals  PT Education - 03/22/18 1642    Education Details  do not do brandt-daroff or Epley again today. If has symptoms when lies down tonight, can begin again tomorrow. increase water intake    Person(s) Educated  Patient    Methods  Explanation    Comprehension  Verbalized understanding          PT Long Term Goals - 03/19/18 1555      PT LONG TERM GOAL #1   Title  Patient will have negative positional testing for BPPV (Target all LTGs 04/18/2018)    Time  4    Period  Weeks    Status  New    Target Date  04/18/18      PT LONG TERM GOAL #2   Title  Patient will be able to verbalize options for obtaining a PT referral should he have BPPV again and need assistance.     Time  4    Period  Weeks    Status  New      PT LONG TERM GOAL #3   Title  Once BPPV cleared, patient will test negative for hypofunction. (If does not test negative, will update goals to address hypofunction)    Time  4    Period  Weeks    Status  New            Plan - 03/22/18 1654    Clinical Impression  Statement  Patient responded well to cannalith repositioning this date with no symptoms on last repetition. Noted slightly +HIT to the left and may have slight hypofunction due to prolonged BPPV. Educated pt can cancel future appts if he is clear of symptoms.     Rehab Potential  Good    PT Frequency  2x / week    PT Duration  4 weeks    PT Treatment/Interventions  ADLs/Self Care Home Management;Canalith Repostioning;Therapeutic activities;Therapeutic exercise;Balance training;Patient/family education;Neuromuscular re-education;Vestibular;Visual/perceptual remediation/compensation    PT Next Visit Plan  assess Lt Dix-Hallpike and treat if indicated; if negative assess for hypofunction    Consulted and Agree with Plan of Care  Patient       Patient will benefit from skilled therapeutic intervention in order to improve the following deficits and impairments:  Decreased activity tolerance, Decreased balance, Decreased knowledge of precautions, Difficulty walking, Dizziness, Impaired vision/preception  Visit Diagnosis: BPPV (benign paroxysmal positional vertigo), bilateral  Dizziness and giddiness  Unsteadiness on feet     Problem List Patient Active Problem List   Diagnosis Date Noted  . Hallux rigidus of right foot 02/21/2018  . Chronic right SI joint pain 01/24/2018  . Right sided abdominal pain 12/26/2017  . Cough 05/02/2011  . Acute back pain 05/02/2011  . INGUINAL PAIN, RIGHT 03/24/2010  . SHOULDER PAIN, LEFT 07/02/2009  . KNEE PAIN, RIGHT 05/04/2009  . BACK PAIN, THORACIC REGION, RIGHT 10/15/2008  . ITBS, RIGHT KNEE 10/15/2008  . HYPERLIPIDEMIA 08/07/2008    Rexanne Mano, PT 03/22/2018, 5:06 PM  [facility] [address] [place], [place], [address] Phone: [phone]   Fax:  [phone]  Name: James Ballard MRN: [id number] Date of Birth: 07-25-1965

## 2018-03-26 ENCOUNTER — Ambulatory Visit: Payer: BLUE CROSS/BLUE SHIELD | Admitting: Physical Therapy

## 2018-04-12 ENCOUNTER — Encounter: Payer: Self-pay | Admitting: Family Medicine

## 2018-04-12 ENCOUNTER — Ambulatory Visit (INDEPENDENT_AMBULATORY_CARE_PROVIDER_SITE_OTHER): Payer: BLUE CROSS/BLUE SHIELD | Admitting: Family Medicine

## 2018-04-12 DIAGNOSIS — M2021 Hallux rigidus, right foot: Secondary | ICD-10-CM

## 2018-04-12 DIAGNOSIS — G8929 Other chronic pain: Secondary | ICD-10-CM | POA: Diagnosis not present

## 2018-04-12 DIAGNOSIS — M533 Sacrococcygeal disorders, not elsewhere classified: Secondary | ICD-10-CM | POA: Diagnosis not present

## 2018-04-12 NOTE — Patient Instructions (Signed)
Good to see you  Exercises 3 times a week.  pennsaid pinkie amount topically 2 times daily as needed.  Read about PRP  Keep working at it   See me again when you need me and call [phone]

## 2018-04-12 NOTE — Assessment & Plan Note (Signed)
Continues to have chronic pain.  Patient was given an injection at one point and had hours of relief but then seemed to come back again.  Discussed the possibility of PRP.  Do not feel significantly optimistic.  Advanced imaging over the course of time has not showed any significant irregularities.  Discussed that without the irregularities do not know if any surgical intervention such as fusion would be beneficial.  We discussed the possibility of referral to discuss with an orthopedic surgeon which patient declined.  Patient will continue conservative therapy and follow-up with me as needed

## 2018-04-12 NOTE — Assessment & Plan Note (Signed)
Doing better with conservative therapy.  Discussed continuing the exercises to avoid any type of progression of the breakdown of the transverse arch.  Follow-up as needed

## 2018-04-12 NOTE — Progress Notes (Signed)
[facility] [name],  [address] Phone: [phone] Subjective:    I James Ballard am serving as a Education administrator for Dr. Hulan Saas.     CC: back pain and foot pain follow-up  PPJ:[id number]       Updated 04/12/2018  James Ballard is a 53 y.o. male coming in with complaint of right foot pain. States he's doing ok. Orthotics are doing ok. Has moved some of the postings for more comfort.  Overall feels of the feet seem to be making some improvement.  Patient states that the toe is not as painful.  Regarding the back patient continues to have sacroiliac joint discomfort.  On the right side.  Patient did have x-rays that were fairly unremarkable.  Patient's previous MRI in 2018 also did not show any significant abnormality of the sacroiliac joint.  Patient though states that he is just with his pain at all times.  Never without any some type of discomfort.  Does the exercises faithfully.  Is able to workout without any worsening pain though usually.     Past Medical History:  Diagnosis Date  . Hyperlipidemia    Past Surgical History:  Procedure Laterality Date  . HEMORROIDECTOMY    . KNEE ARTHROSCOPY     left  . sesmoid fx- excised    . ULNAR NERVE TRANSPOSITION     Social History   Socioeconomic History  . Marital status: Married    Spouse name: Not on file  . Number of children: Not on file  . Years of education: Not on file  . Highest education level: Not on file  Occupational History  . Not on file  Social Needs  . Financial resource strain: Not on file  . Food insecurity:    Worry: Not on file    Inability: Not on file  . Transportation needs:    Medical: Not on file    Non-medical: Not on file  Tobacco Use  . Smoking status: Never Smoker  . Smokeless tobacco: Never Used  Substance and Sexual Activity  . Alcohol use: Yes    Comment: occasionally  . Drug use: No  . Sexual activity: Not on file  Lifestyle  .  Physical activity:    Days per week: Not on file    Minutes per session: Not on file  . Stress: Not on file  Relationships  . Social connections:    Talks on phone: Not on file    Gets together: Not on file    Attends religious service: Not on file    Active member of club or organization: Not on file    Attends meetings of clubs or organizations: Not on file    Relationship status: Not on file  Other Topics Concern  . Not on file  Social History Narrative  . Not on file   Allergies  Allergen Reactions  . Doxycycline Hyclate     REACTION: rash   Family History  Problem Relation Age of Onset  . Heart disease Father        pacemaker--has been removed-may have never needed it  . Migraines Sister   . Hyperlipidemia Brother   . Migraines Brother      Current Facility-Administered Medications (Endocrine & Metabolic):  .  betamethasone acetate-betamethasone sodium phosphate (CELESTONE) injection 3 mg .  betamethasone acetate-betamethasone sodium phosphate (CELESTONE) injection 3 mg      Current Outpatient Medications (Analgesics):  Marland Kitchen  SUMAtriptan (IMITREX)  100 MG tablet, Take 1 tablet (100 mg total) by mouth every 2 (two) hours as needed for migraine. May repeat in 2 hours if headache persists or recurs.     Current Outpatient Medications (Other):  Marland Kitchen  Vitamin D, Ergocalciferol, (DRISDOL) 1.25 MG (50000 UT) CAPS capsule, Take 1 capsule (50,000 Units total) by mouth every 7 (seven) days.     Past medical history, social, surgical and family history all reviewed in electronic medical record.  No pertanent information unless stated regarding to the chief complaint.   Review of Systems:  No headache, visual changes, nausea, vomiting, diarrhea, constipation, dizziness, abdominal pain, skin rash, fevers, chills, night sweats, weight loss, swollen lymph nodes, body aches, joint swelling,  chest pain, shortness of breath, mood changes.  Positive muscle aches  Objective    Blood pressure 120/90, pulse (!) 59, height 6\' 2"  (1.88 m), weight 196 lb (88.9 kg), SpO2 96 %.    General: No apparent distress alert and oriented x3 mood and affect normal, dressed appropriately.  HEENT: Pupils equal, extraocular movements intact  Respiratory: Patient's speak in full sentences and does not appear short of breath  Cardiovascular: No lower extremity edema, non tender, no erythema  Skin: Warm dry intact with no signs of infection or rash on extremities or on axial skeleton.  Abdomen: Soft nontender  Neuro: Cranial nerves II through XII are intact, neurovascularly intact in all extremities with 2+ DTRs and 2+ pulses.  Lymph: No lymphadenopathy of posterior or anterior cervical chain or axillae bilaterally.  Gait normal with good balance and coordination.  MSK:  Non tender with full range of motion and good stability and symmetric strength and tone of shoulders, elbows, wrist, hip, knee and ankles bilaterally.  Foot exam still shows the patient does have a hallux rigidus noted.  Patient does have splaying between the first and second toes with mild breakdown of the transverse arch.  Less tenderness than previous exam.  Back exam shows some mild loss of lordosis.  Patient still tender to palpation in the right sacroiliac joint negative straight leg test.  Mild positive Corky Sox test.    Impression and Recommendations:     . The above documentation has been reviewed and is accurate and complete Lyndal Pulley, DO       Note: This dictation was prepared with Dragon dictation along with smaller phrase technology. Any transcriptional errors that result from this process are unintentional.

## 2018-04-17 ENCOUNTER — Encounter: Payer: Self-pay | Admitting: Family Medicine

## 2018-04-17 ENCOUNTER — Ambulatory Visit (INDEPENDENT_AMBULATORY_CARE_PROVIDER_SITE_OTHER): Payer: BLUE CROSS/BLUE SHIELD | Admitting: Family Medicine

## 2018-04-17 VITALS — BP 116/62 | HR 106 | Temp 98.3°F | Wt 197.3 lb

## 2018-04-17 DIAGNOSIS — R6889 Other general symptoms and signs: Secondary | ICD-10-CM | POA: Diagnosis not present

## 2018-04-17 DIAGNOSIS — J101 Influenza due to other identified influenza virus with other respiratory manifestations: Secondary | ICD-10-CM | POA: Diagnosis not present

## 2018-04-17 LAB — POC INFLUENZA A&B (BINAX/QUICKVUE)
Influenza A, POC: POSITIVE — AB
Influenza B, POC: NEGATIVE

## 2018-04-17 MED ORDER — OSELTAMIVIR PHOSPHATE 75 MG PO CAPS: 75.0000 mg | ORAL_CAPSULE | Freq: Two times a day (BID) | ORAL | 0 refills | Status: DC

## 2018-04-17 MED ORDER — SUMATRIPTAN SUCCINATE 100 MG PO TABS: 100.0000 mg | ORAL_TABLET | ORAL | 0 refills | Status: DC | PRN

## 2018-04-17 NOTE — Progress Notes (Signed)
   Subjective:    Patient ID: Laretta Alstrom, male    DOB: [birth year], 53 y.o.   MRN: [id number]  HPI Here for 2 days of fever, body aches, a headache, and a dry cough. On Ibuprofen and Nyquil.    Review of Systems  Constitutional: Positive for fever.  HENT: Positive for congestion. Negative for postnasal drip, sinus pressure, sinus pain and sore throat.   Eyes: Negative.   Respiratory: Positive for cough.   Gastrointestinal: Negative.   Musculoskeletal: Positive for myalgias.  Neurological: Positive for headaches.       Objective:   Physical Exam Constitutional:      Appearance: He is ill-appearing.  HENT:     Right Ear: Tympanic membrane and ear canal normal.     Left Ear: Tympanic membrane and ear canal normal.     Nose: Nose normal.     Mouth/Throat:     Pharynx: Oropharynx is clear.  Eyes:     Conjunctiva/sclera: Conjunctivae normal.  Pulmonary:     Effort: Pulmonary effort is normal. No respiratory distress.     Breath sounds: Normal breath sounds. No stridor. No wheezing, rhonchi or rales.  Lymphadenopathy:     Cervical: No cervical adenopathy.  Neurological:     Mental Status: He is alert.           Assessment & Plan:  Influenza A, treat with Tamiflu.  Alysia Penna, MD

## 2018-05-19 NOTE — Progress Notes (Deleted)
[name] [address] [place], [place] [address] Phone: [phone] Subjective:    I'm seeing this patient by the request  of:    CC: Back pain follow-up  James Ballard  James Ballard is a 53 y.o. male coming in with complaint of ***  Onset-  Location Duration-  Character- Aggravating factors- Reliving factors-  Therapies tried-  Severity-     Past Medical History:  Diagnosis Date  . Hyperlipidemia    Past Surgical History:  Procedure Laterality Date  . HEMORROIDECTOMY    . KNEE ARTHROSCOPY     left  . sesmoid fx- excised    . ULNAR NERVE TRANSPOSITION     Social History   Socioeconomic History  . Marital status: Married    Spouse name: Not on file  . Number of children: Not on file  . Years of education: Not on file  . Highest education level: Not on file  Occupational History  . Not on file  Social Needs  . Financial resource strain: Not on file  . Food insecurity:    Worry: Not on file    Inability: Not on file  . Transportation needs:    Medical: Not on file    Non-medical: Not on file  Tobacco Use  . Smoking status: Never Smoker  . Smokeless tobacco: Never Used  Substance and Sexual Activity  . Alcohol use: Yes    Comment: occasionally  . Drug use: No  . Sexual activity: Not on file  Lifestyle  . Physical activity:    Days per week: Not on file    Minutes per session: Not on file  . Stress: Not on file  Relationships  . Social connections:    Talks on phone: Not on file    Gets together: Not on file    Attends religious service: Not on file    Active member of club or organization: Not on file    Attends meetings of clubs or organizations: Not on file    Relationship status: Not on file  Other Topics Concern  . Not on file  Social History Narrative  . Not on file   Allergies  Allergen Reactions  . Doxycycline Hyclate     REACTION: rash   Family History  Problem Relation Age of Onset  . Heart  disease Father        pacemaker--has been removed-may have never needed it  . Migraines Sister   . Hyperlipidemia Brother   . Migraines Brother      Current Facility-Administered Medications (Endocrine & Metabolic):  .  betamethasone acetate-betamethasone sodium phosphate (CELESTONE) injection 3 mg .  betamethasone acetate-betamethasone sodium phosphate (CELESTONE) injection 3 mg      Current Outpatient Medications (Analgesics):  Marland Kitchen  SUMAtriptan (IMITREX) 100 MG tablet, Take 1 tablet (100 mg total) by mouth every 2 (two) hours as needed for migraine. May repeat in 2 hours if headache persists or recurs.     Current Outpatient Medications (Other):  .  oseltamivir (TAMIFLU) 75 MG capsule, Take 1 capsule (75 mg total) by mouth 2 (two) times daily. .  Vitamin D, Ergocalciferol, (DRISDOL) 1.25 MG (50000 UT) CAPS capsule, Take 1 capsule (50,000 Units total) by mouth every 7 (seven) days.     Past medical history, social, surgical and family history all reviewed in electronic medical record.  No pertanent information unless stated regarding to the chief complaint.   Review of Systems:  No headache, visual changes,  nausea, vomiting, diarrhea, constipation, dizziness, abdominal pain, skin rash, fevers, chills, night sweats, weight loss, swollen lymph nodes, body aches, joint swelling, muscle aches, chest pain, shortness of breath, mood changes.   Objective  There were no vitals taken for this visit. Systems examined below as of    General: No apparent distress alert and oriented x3 mood and affect normal, dressed appropriately.  HEENT: Pupils equal, extraocular movements intact  Respiratory: Patient's speak in full sentences and does not appear short of breath  Cardiovascular: No lower extremity edema, non tender, no erythema  Skin: Warm dry intact with no signs of infection or rash on extremities or on axial skeleton.  Abdomen: Soft nontender  Neuro: Cranial nerves II through XII  are intact, neurovascularly intact in all extremities with 2+ DTRs and 2+ pulses.  Lymph: No lymphadenopathy of posterior or anterior cervical chain or axillae bilaterally.  Gait normal with good balance and coordination.  MSK:  Non tender with full range of motion and good stability and symmetric strength and tone of shoulders, elbows, wrist, hip, knee and ankles bilaterally.  Back Exam:  Inspection: Unremarkable  Motion: Flexion 45 deg, Extension 45 deg, Side Bending to 45 deg bilaterally,  Rotation to 45 deg bilaterally  SLR laying: Negative  XSLR laying: Negative  Palpable tenderness: None. FABER: negative. Sensory change: Gross sensation intact to all lumbar and sacral dermatomes.  Reflexes: 2+ at both patellar tendons, 2+ at achilles tendons, Babinski's downgoing.  Strength at foot  Plantar-flexion: 5/5 Dorsi-flexion: 5/5 Eversion: 5/5 Inversion: 5/5  Leg strength  Quad: 5/5 Hamstring: 5/5 Hip flexor: 5/5 Hip abductors: 5/5  Gait unremarkable.   Impression and Recommendations:     This case required medical decision making of moderate complexity. The above documentation has been reviewed and is accurate and complete Lyndal Pulley, DO       Note: This dictation was prepared with Dragon dictation along with smaller phrase technology. Any transcriptional errors that result from this process are unintentional.

## 2018-05-21 ENCOUNTER — Ambulatory Visit: Payer: BLUE CROSS/BLUE SHIELD | Admitting: Family Medicine

## 2018-06-19 ENCOUNTER — Ambulatory Visit (INDEPENDENT_AMBULATORY_CARE_PROVIDER_SITE_OTHER): Payer: BLUE CROSS/BLUE SHIELD | Admitting: Family Medicine

## 2018-06-19 ENCOUNTER — Other Ambulatory Visit: Payer: Self-pay

## 2018-06-19 ENCOUNTER — Ambulatory Visit: Payer: Self-pay

## 2018-06-19 ENCOUNTER — Encounter: Payer: Self-pay | Admitting: Family Medicine

## 2018-06-19 VITALS — BP 118/84 | HR 67 | Ht 74.0 in | Wt 197.0 lb

## 2018-06-19 DIAGNOSIS — G8929 Other chronic pain: Secondary | ICD-10-CM

## 2018-06-19 DIAGNOSIS — M533 Sacrococcygeal disorders, not elsewhere classified: Principal | ICD-10-CM

## 2018-06-19 NOTE — Patient Instructions (Addendum)
Good to see you  No ice or ibuprofen for 72 hours.  If in a lot of pain ok  Will take weeks to notice a lot of improvement with the ideal situation being in 6 weeks See me again in 5-6 weeks    .

## 2018-06-19 NOTE — Assessment & Plan Note (Signed)
Patient given a PRP injection today.  We will see how patient responds.  Discussed icing regimen and home exercise.  Discussed which activities to do which wants to avoid. Patient given a 6-week return to activity over the course of time.  Post PRP handout given.

## 2018-06-19 NOTE — Progress Notes (Signed)
[name] [place], [place] [address] Phone: [phone] Subjective:   James Ballard, am serving as a scribe for Dr. Hulan Saas.   CC: SI joint pain right side   [id number]   04/12/2018: Continues to have chronic pain.  Patient was given an injection at one point and had hours of relief but then seemed to come back again.  Discussed the possibility of PRP.  Do not feel significantly optimistic.  Advanced imaging over the course of time has not showed any significant irregularities.  Discussed that without the irregularities do not know if any surgical intervention such as fusion would be beneficial.  We discussed the possibility of referral to discuss with an orthopedic surgeon which patient declined.  Patient will continue conservative therapy and follow-up with me as needed  Update 06/19/2018: James Ballard is a 53 y.o. male coming in with complaint of right sided SI joint pain. Patient states he did have some improvement with a steroid injection.  Here for possible PRP.  Has been going on weeks with Ballard significant improvement..       Past Medical History:  Diagnosis Date  . Hyperlipidemia    Past Surgical History:  Procedure Laterality Date  . HEMORROIDECTOMY    . KNEE ARTHROSCOPY     left  . sesmoid fx- excised    . ULNAR NERVE TRANSPOSITION     Social History   Socioeconomic History  . Marital status: Married    Spouse name: Not on file  . Number of children: Not on file  . Years of education: Not on file  . Highest education level: Not on file  Occupational History  . Not on file  Social Needs  . Financial resource strain: Not on file  . Food insecurity:    Worry: Not on file    Inability: Not on file  . Transportation needs:    Medical: Not on file    Non-medical: Not on file  Tobacco Use  . Smoking status: Never Smoker  . Smokeless tobacco: Never Used  Substance and Sexual Activity  . Alcohol use: Yes    Comment: occasionally  . Drug use: Ballard  . Sexual activity: Not on file  Lifestyle  . Physical activity:    Days per week: Not on file    Minutes per session: Not on file  . Stress: Not on file  Relationships  . Social connections:    Talks on phone: Not on file    Gets together: Not on file    Attends religious service: Not on file    Active member of club or organization: Not on file    Attends meetings of clubs or organizations: Not on file    Relationship status: Not on file  Other Topics Concern  . Not on file  Social History Narrative  . Not on file   Allergies  Allergen Reactions  . Doxycycline Hyclate     REACTION: rash   Family History  Problem Relation Age of Onset  . Heart disease Father        pacemaker--has been removed-may have never needed it  . Migraines Sister   . Hyperlipidemia Brother   . Migraines Brother      Current Facility-Administered Medications (Endocrine & Metabolic):  .  betamethasone acetate-betamethasone sodium phosphate (CELESTONE) injection 3 mg .  betamethasone acetate-betamethasone sodium phosphate (CELESTONE) injection 3 mg      Current Outpatient Medications (Analgesics):  .  SUMAtriptan (IMITREX) 100 MG tablet, Take 1 tablet (100 mg total) by mouth every 2 (two) hours as needed for migraine. May repeat in 2 hours if headache persists or recurs.     Current Outpatient Medications (Other):  .  oseltamivir (TAMIFLU) 75 MG capsule, Take 1 capsule (75 mg total) by mouth 2 (two) times daily. .  Vitamin D, Ergocalciferol, (DRISDOL) 1.25 MG (50000 UT) CAPS capsule, Take 1 capsule (50,000 Units total) by mouth every 7 (seven) days.     Past medical history, social, surgical and family history all reviewed in electronic medical record.  Ballard pertanent information unless stated regarding to the chief complaint.   Review of Systems:  Ballard headache, visual changes, nausea, vomiting, diarrhea, constipation, dizziness, abdominal pain, skin  rash, fevers, chills, night sweats, weight loss, swollen lymph nodes, body aches, joint swelling, chest pain, shortness of breath, mood changes.  Positive muscle aches  Objective  Blood pressure 118/84, pulse 67, height 6\' 2"  (1.88 m), weight 197 lb (89.4 kg), SpO2 97 %.    General: Ballard apparent distress alert and oriented x3 mood and affect normal, dressed appropriately.  HEENT: Pupils equal, extraocular movements intact  Respiratory: Patient's speak in full sentences and does not appear short of breath  Cardiovascular: Ballard lower extremity edema, non tender, Ballard erythema  Skin: Warm dry intact with Ballard signs of infection or rash on extremities or on axial skeleton.  Abdomen: Soft nontender  Neuro: Cranial nerves II through XII are intact, neurovascularly intact in all extremities with 2+ DTRs and 2+ pulses.  Lymph: Ballard lymphadenopathy of posterior or anterior cervical chain or axillae bilaterally.  Gait normal with good balance and coordination.  MSK:  Non tender with full range of motion and good stability and symmetric strength and tone of shoulders, elbows, wrist, hip, knee and ankles bilaterally.    Back exam has loss of lordosis.  Severe tenderness to palpation over the right sacroiliac joint.  Positive Corky Sox.  Negative straight leg test but does have some tightness of the hamstrings.   Procedure: Real-time Ultrasound Guided Injection of right sacroiliac joint Device: GE Logiq Q7 Ultrasound guided injection is preferred based studies that show increased duration, increased effect, greater accuracy, decreased procedural pain, increased response rate, and decreased cost with ultrasound guided versus blind injection.  Verbal informed consent obtained.  Time-out conducted.  Noted Ballard overlying erythema, induration, or other signs of local infection.  Skin prepped in a sterile fashion.  Local anesthesia: Topical Ethyl chloride.  With sterile technique and under real time ultrasound guidance:  21-gauge needle used 2 inches long.  Injecting 3 cc of 0.5% Marcaine.  Then injected 3 cc of pre-centrifuge PRP Completed without difficulty  Pain immediately resolved suggesting accurate placement of the medication.  Advised to call if fevers/chills, erythema, induration, drainage, or persistent bleeding.  Images permanently stored and available for review in the ultrasound unit.  Impression: Technically successful ultrasound guided injection.   Impression and Recommendations:     This case required medical decision making of moderate complexity. The above documentation has been reviewed and is accurate and complete Lyndal Pulley, DO       Note: This dictation was prepared with Dragon dictation along with smaller phrase technology. Any transcriptional errors that result from this process are unintentional.

## 2018-07-31 ENCOUNTER — Encounter: Payer: Self-pay | Admitting: Family Medicine

## 2018-07-31 ENCOUNTER — Ambulatory Visit (INDEPENDENT_AMBULATORY_CARE_PROVIDER_SITE_OTHER)
Admission: RE | Admit: 2018-07-31 | Discharge: 2018-07-31 | Disposition: A | Discharge status: Home / Self Care | Payer: BLUE CROSS/BLUE SHIELD | Source: Ambulatory Visit | Attending: Family Medicine | Admitting: Family Medicine

## 2018-07-31 ENCOUNTER — Ambulatory Visit (INDEPENDENT_AMBULATORY_CARE_PROVIDER_SITE_OTHER): Payer: BLUE CROSS/BLUE SHIELD | Admitting: Family Medicine

## 2018-07-31 ENCOUNTER — Other Ambulatory Visit: Payer: Self-pay

## 2018-07-31 ENCOUNTER — Other Ambulatory Visit (INDEPENDENT_AMBULATORY_CARE_PROVIDER_SITE_OTHER): Payer: BLUE CROSS/BLUE SHIELD

## 2018-07-31 VITALS — BP 108/80 | HR 71 | Ht 74.0 in | Wt 192.0 lb

## 2018-07-31 DIAGNOSIS — M533 Sacrococcygeal disorders, not elsewhere classified: Secondary | ICD-10-CM | POA: Diagnosis not present

## 2018-07-31 DIAGNOSIS — M2550 Pain in unspecified joint: Secondary | ICD-10-CM

## 2018-07-31 DIAGNOSIS — M25551 Pain in right hip: Secondary | ICD-10-CM

## 2018-07-31 DIAGNOSIS — G8929 Other chronic pain: Secondary | ICD-10-CM

## 2018-07-31 LAB — IBC PANEL
Iron: 77 ug/dL (ref 42–165)
Saturation Ratios: 25.7 % (ref 20.0–50.0)
Transferrin: 214 mg/dL (ref 212.0–360.0)

## 2018-07-31 LAB — TSH: TSH: 1.43 u[IU]/mL (ref 0.35–4.50)

## 2018-07-31 LAB — CBC WITH DIFFERENTIAL/PLATELET
Basophils Absolute: 0 10*3/uL (ref 0.0–0.1)
Basophils Relative: 0.7 % (ref 0.0–3.0)
Eosinophils Absolute: 0.1 10*3/uL (ref 0.0–0.7)
Eosinophils Relative: 1.5 % (ref 0.0–5.0)
HCT: 41.7 % (ref 39.0–52.0)
Hemoglobin: 14.5 g/dL (ref 13.0–17.0)
Lymphocytes Relative: 31.5 % (ref 12.0–46.0)
Lymphs Abs: 1.4 10*3/uL (ref 0.7–4.0)
MCHC: 34.8 g/dL (ref 30.0–36.0)
MCV: 82.9 fl (ref 78.0–100.0)
Monocytes Absolute: 0.5 10*3/uL (ref 0.1–1.0)
Monocytes Relative: 12.2 % — ABNORMAL HIGH (ref 3.0–12.0)
Neutro Abs: 2.4 10*3/uL (ref 1.4–7.7)
Neutrophils Relative %: 54.1 % (ref 43.0–77.0)
Platelets: 195 10*3/uL (ref 150.0–400.0)
RBC: 5.03 Mil/uL (ref 4.22–5.81)
RDW: 12.6 % (ref 11.5–15.5)
WBC: 4.5 10*3/uL (ref 4.0–10.5)

## 2018-07-31 LAB — FERRITIN: Ferritin: 105 ng/mL (ref 22.0–322.0)

## 2018-07-31 LAB — VITAMIN D 25 HYDROXY (VIT D DEFICIENCY, FRACTURES): VITD: 23.25 ng/mL — ABNORMAL LOW (ref 30.00–100.00)

## 2018-07-31 LAB — SEDIMENTATION RATE: Sed Rate: 12 mm/hr (ref 0–20)

## 2018-07-31 LAB — C-REACTIVE PROTEIN: CRP: <1 mg/dL (ref 0.5–20.0)

## 2018-07-31 LAB — URIC ACID: Uric Acid, Serum: 5.6 mg/dL (ref 4.0–7.8)

## 2018-07-31 NOTE — Progress Notes (Signed)
[name] [place], [place] [address] Phone: [phone] Subjective:   Fontaine No, am serving as a scribe for Dr. Hulan Saas.   CC: Back pain and sacroiliac follow-up  AYT:[id number]   06/19/2018: Patient given a PRP injection today.  We will see how patient responds.  Discussed icing regimen and home exercise.  Discussed which activities to do which wants to avoid. Patient given a 6-week return to activity over the course of time.  Post PRP handout given.  Update 07/31/2018: STEVEN BASSO is a 53 y.o. male coming in with complaint of back pain. Has not noticed any difference from the PRP injection. Constant right sided SI joint pain. Is able to play tennis but the soreness never goes away. Stays away from serving. Does not use any medication for pain.  Denies any radiating symptoms. Feels like the joint is tight and it needs to pop.  Patient continues to have aching sensation every day.  Described as dull, throbbing aching sensation.     Past Medical History:  Diagnosis Date  . Hyperlipidemia    Past Surgical History:  Procedure Laterality Date  . HEMORROIDECTOMY    . KNEE ARTHROSCOPY     left  . sesmoid fx- excised    . ULNAR NERVE TRANSPOSITION     Social History   Socioeconomic History  . Marital status: Married    Spouse name: Not on file  . Number of children: Not on file  . Years of education: Not on file  . Highest education level: Not on file  Occupational History  . Not on file  Social Needs  . Financial resource strain: Not on file  . Food insecurity:    Worry: Not on file    Inability: Not on file  . Transportation needs:    Medical: Not on file    Non-medical: Not on file  Tobacco Use  . Smoking status: Never Smoker  . Smokeless tobacco: Never Used  Substance and Sexual Activity  . Alcohol use: Yes    Comment: occasionally  . Drug use: No  . Sexual activity: Not on file  Lifestyle  . Physical  activity:    Days per week: Not on file    Minutes per session: Not on file  . Stress: Not on file  Relationships  . Social connections:    Talks on phone: Not on file    Gets together: Not on file    Attends religious service: Not on file    Active member of club or organization: Not on file    Attends meetings of clubs or organizations: Not on file    Relationship status: Not on file  Other Topics Concern  . Not on file  Social History Narrative  . Not on file   Allergies  Allergen Reactions  . Doxycycline Hyclate     REACTION: rash   Family History  Problem Relation Age of Onset  . Heart disease Father        pacemaker--has been removed-may have never needed it  . Migraines Sister   . Hyperlipidemia Brother   . Migraines Brother      Current Facility-Administered Medications (Endocrine & Metabolic):  .  betamethasone acetate-betamethasone sodium phosphate (CELESTONE) injection 3 mg .  betamethasone acetate-betamethasone sodium phosphate (CELESTONE) injection 3 mg      Current Outpatient Medications (Analgesics):  Marland Kitchen  SUMAtriptan (IMITREX) 100 MG tablet, Take 1 tablet (100 mg total) by mouth  every 2 (two) hours as needed for migraine. May repeat in 2 hours if headache persists or recurs.     Current Outpatient Medications (Other):  .  omeprazole (PRILOSEC) 10 MG capsule, Take 10 mg by mouth daily. Marland Kitchen  oseltamivir (TAMIFLU) 75 MG capsule, Take 1 capsule (75 mg total) by mouth 2 (two) times daily. .  Vitamin D, Ergocalciferol, (DRISDOL) 1.25 MG (50000 UT) CAPS capsule, Take 1 capsule (50,000 Units total) by mouth every 7 (seven) days.     Past medical history, social, surgical and family history all reviewed in electronic medical record.  No pertanent information unless stated regarding to the chief complaint.   Review of Systems:  No headache, visual changes, nausea, vomiting, diarrhea, constipation, dizziness, abdominal pain, skin rash, fevers, chills, night  sweats, weight loss, swollen lymph nodes, body aches, joint swelling, chest pain, shortness of breath, mood changes.  Positive muscle aches  Objective  Blood pressure 108/80, pulse 71, height 6\' 2"  (1.88 m), weight 87.1 kg, SpO2 99 %.   General: No apparent distress alert and oriented x3 mood and affect normal, dressed appropriately.  HEENT: Pupils equal, extraocular movements intact  Respiratory: Patient's speak in full sentences and does not appear short of breath  Cardiovascular: No lower extremity edema, non tender, no erythema  Skin: Warm dry intact with no signs of infection or rash on extremities or on axial skeleton.  Abdomen: Soft nontender  Neuro: Cranial nerves II through XII are intact, neurovascularly intact in all extremities with 2+ DTRs and 2+ pulses.  Lymph: No lymphadenopathy of posterior or anterior cervical chain or axillae bilaterally.  Gait normal with good balance and coordination.  MSK:  Non tender with full range of motion and good stability and symmetric strength and tone of shoulders, elbows, wrist, hip, knee and ankles bilaterally.  Back Exam:  Inspection: Unremarkable  Motion: Flexion 45 deg, Extension 20 deg, Side Bending to 45 deg bilaterally,  Rotation to 35 deg bilaterally  SLR laying: Negative but tightness.  XSLR laying: Negative  Palpable tenderness: Tender to palpation of the right sacroiliac joint. FABER: Positive right. Sensory change: Gross sensation intact to all lumbar and sacral dermatomes.  Reflexes: 2+ at both patellar tendons, 2+ at achilles tendons, Babinski's downgoing.  Strength at foot  Plantar-flexion: 5/5 Dorsi-flexion: 5/5 Eversion: 5/5 Inversion: 5/5  Leg strength  Quad: 5/5 Hamstring: 5/5 Hip flexor: 5/5 Hip abductors: 4/5 but symmetric       Impression and Recommendations:     This case required medical decision making of moderate complexity. The above documentation has been reviewed and is accurate and complete Lyndal Pulley, DO       Note: This dictation was prepared with Dragon dictation along with smaller phrase technology. Any transcriptional errors that result from this process are unintentional.

## 2018-07-31 NOTE — Assessment & Plan Note (Signed)
Patient is made no significant improvement with the PRP injection.  This is somewhat disheartening to the patient.  Patient has had significant work-up within the last 2 years including an MRI of the lumbar spine as well as an MRI of the sacral area with no significant findings.  At this point I would like to rule out a possible intra-articular hip pathology that could be giving more of a referred pain.  X-rays and MR arthrogram ordered today.  Laboratory work-up to rule out autoimmune disease could be also helpful.  Patient will have this drawn as well.  We discussed continuing the home exercises and icing regimen.  Depending on findings we will discuss further medical management.  Spent  25 minutes with patient face-to-face and had greater than 50% of counseling including as described above in assessment and plan.

## 2018-07-31 NOTE — Patient Instructions (Addendum)
Good to see you  Ice is your friend We will get labs today and the hip xray  Ordered a Hip MR-arthrogram and they will call you .  Possibly sign up for my chart to make it easier to communicate and we will figure out next steps I am here if you have questions.

## 2018-08-01 LAB — PTH, INTACT AND CALCIUM
Calcium: 9.5 mg/dL (ref 8.6–10.3)
PTH: 25 pg/mL (ref 14–64)

## 2018-08-02 ENCOUNTER — Encounter: Payer: Self-pay | Admitting: Family Medicine

## 2018-08-02 LAB — CYCLIC CITRUL PEPTIDE ANTIBODY, IGG: Cyclic Citrullin Peptide Ab: <16 UNITS

## 2018-08-02 LAB — CALCIUM, IONIZED: Calcium, Ion: 5.05 mg/dL (ref 4.8–5.6)

## 2018-08-02 LAB — PTH, INTACT AND CALCIUM
Calcium: 9.5 mg/dL (ref 8.6–10.3)
PTH: 15 pg/mL (ref 14–64)

## 2018-08-02 LAB — ANGIOTENSIN CONVERTING ENZYME: Angiotensin-Converting Enzyme: 34 U/L (ref 9–67)

## 2018-08-02 LAB — ANA: Anti Nuclear Antibody (ANA): NEGATIVE

## 2018-08-02 LAB — RHEUMATOID FACTOR: Rhuematoid fact SerPl-aCnc: 17 IU/mL — ABNORMAL HIGH (ref <14)

## 2018-10-08 ENCOUNTER — Other Ambulatory Visit: Payer: Self-pay

## 2018-10-08 ENCOUNTER — Ambulatory Visit
Admission: RE | Admit: 2018-10-08 | Discharge: 2018-10-08 | Disposition: A | Discharge status: Home / Self Care | Payer: BC Managed Care – PPO | Source: Ambulatory Visit | Attending: Family Medicine | Admitting: Family Medicine

## 2018-10-08 DIAGNOSIS — M25551 Pain in right hip: Secondary | ICD-10-CM

## 2018-10-08 DIAGNOSIS — S73191A Other sprain of right hip, initial encounter: Secondary | ICD-10-CM | POA: Diagnosis not present

## 2018-10-08 MED ORDER — IOPAMIDOL (ISOVUE-M 200) INJECTION 41%
15.0000 mL | Freq: Once | INTRAMUSCULAR | Status: AC
  Administered 2018-10-08: 15 mL via INTRA_ARTICULAR

## 2018-10-09 ENCOUNTER — Other Ambulatory Visit: Payer: Self-pay

## 2018-10-09 ENCOUNTER — Encounter: Payer: Self-pay | Admitting: Family Medicine

## 2018-10-09 DIAGNOSIS — M25551 Pain in right hip: Secondary | ICD-10-CM

## 2018-10-25 DIAGNOSIS — S336XXD Sprain of sacroiliac joint, subsequent encounter: Secondary | ICD-10-CM | POA: Diagnosis not present

## 2018-10-25 DIAGNOSIS — M6281 Muscle weakness (generalized): Secondary | ICD-10-CM | POA: Diagnosis not present

## 2018-10-25 DIAGNOSIS — M545 Low back pain: Secondary | ICD-10-CM | POA: Diagnosis not present

## 2018-10-25 DIAGNOSIS — M25551 Pain in right hip: Secondary | ICD-10-CM | POA: Diagnosis not present

## 2018-11-08 DIAGNOSIS — M25512 Pain in left shoulder: Secondary | ICD-10-CM | POA: Diagnosis not present

## 2018-11-08 DIAGNOSIS — M25551 Pain in right hip: Secondary | ICD-10-CM | POA: Diagnosis not present

## 2018-11-13 DIAGNOSIS — M25551 Pain in right hip: Secondary | ICD-10-CM | POA: Diagnosis not present

## 2018-12-12 ENCOUNTER — Ambulatory Visit (INDEPENDENT_AMBULATORY_CARE_PROVIDER_SITE_OTHER): Payer: BC Managed Care – PPO | Admitting: Podiatry

## 2018-12-12 ENCOUNTER — Other Ambulatory Visit: Payer: Self-pay

## 2018-12-12 ENCOUNTER — Ambulatory Visit: Payer: Self-pay

## 2018-12-12 ENCOUNTER — Encounter: Payer: Self-pay | Admitting: Podiatry

## 2018-12-12 DIAGNOSIS — M722 Plantar fascial fibromatosis: Secondary | ICD-10-CM

## 2018-12-12 MED ORDER — METHYLPREDNISOLONE 4 MG PO TBPK: ORAL_TABLET | ORAL | 0 refills | Status: DC

## 2018-12-12 MED ORDER — DICLOFENAC SODIUM 75 MG PO TBEC: 75.0000 mg | DELAYED_RELEASE_TABLET | Freq: Two times a day (BID) | ORAL | 1 refills | Status: DC

## 2018-12-14 ENCOUNTER — Other Ambulatory Visit: Payer: Self-pay

## 2018-12-14 ENCOUNTER — Other Ambulatory Visit: Payer: BC Managed Care – PPO | Admitting: Orthotics

## 2018-12-14 DIAGNOSIS — M722 Plantar fascial fibromatosis: Secondary | ICD-10-CM | POA: Diagnosis not present

## 2018-12-16 NOTE — Progress Notes (Signed)
   Subjective: 53 y.o. male presenting today with a chief complaint of intermittent sharp pain of the right heel that began about two months ago. 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 He also notes the pain is worse in the morning when he first gets out of bed. He has not had any treatment for his symptoms but does use custom orthotics. Patient is here for further evaluation and treatment.    Past Medical History:  Diagnosis Date  . Hyperlipidemia      Objective: Physical Exam General: The patient is alert and oriented x3 in no acute distress.  Dermatology: Skin is warm, dry and supple bilateral lower extremities. Negative for open lesions or macerations bilateral.   Vascular: Dorsalis Pedis and Posterior Tibial pulses palpable bilateral.  Capillary fill time is immediate to all digits.  Neurological: Epicritic and protective threshold intact bilateral.   Musculoskeletal: Tenderness to palpation to the plantar aspect of the right heel along the plantar fascia. All other joints range of motion within normal limits bilateral. Strength 5/5 in all groups bilateral.   Radiographic exam: Normal osseous mineralization. Joint spaces preserved. No fracture/dislocation/boney destruction. No other soft tissue abnormalities or radiopaque foreign bodies.   Assessment: 1. Plantar fasciitis right  Plan of Care:  1. Patient evaluated. Xrays reviewed.   2. Declined injections.   3. Rx for Medrol Dose Pack placed 4. Rx for Diclofenac ordered for patient. 5. Continue using custom orthotics.  6. Appointment with Liliane Channel, Pedorthist, for custom molded orthotics.  7. Instructed patient regarding therapies and modalities at home to alleviate symptoms.  8. Return to clinic as needed.    Plays tennis, skateboards and boats.    Edrick Kins, DPM [facility]  Dr. Edrick Kins, DPM    [address], [place] [address]                Office [phone]  Fax [phone]

## 2019-01-14 ENCOUNTER — Other Ambulatory Visit: Payer: BC Managed Care – PPO | Admitting: Orthotics

## 2019-01-22 ENCOUNTER — Other Ambulatory Visit: Payer: Self-pay

## 2019-01-22 ENCOUNTER — Ambulatory Visit: Payer: BC Managed Care – PPO | Admitting: Orthotics

## 2019-01-22 DIAGNOSIS — R109 Unspecified abdominal pain: Secondary | ICD-10-CM

## 2019-01-22 DIAGNOSIS — M2021 Hallux rigidus, right foot: Secondary | ICD-10-CM

## 2019-01-22 NOTE — Progress Notes (Signed)
Patient came in today to pick up custom made foot orthotics.  The goals were accomplished and the patient reported no dissatisfaction with said orthotics.  Patient was advised of breakin period and how to report any issues. 

## 2019-02-19 DIAGNOSIS — M25512 Pain in left shoulder: Secondary | ICD-10-CM | POA: Diagnosis not present

## 2019-02-21 DIAGNOSIS — M7542 Impingement syndrome of left shoulder: Secondary | ICD-10-CM | POA: Diagnosis not present

## 2019-03-07 DIAGNOSIS — M7542 Impingement syndrome of left shoulder: Secondary | ICD-10-CM | POA: Diagnosis not present

## 2019-03-15 DIAGNOSIS — M7542 Impingement syndrome of left shoulder: Secondary | ICD-10-CM | POA: Diagnosis not present

## 2019-03-18 DIAGNOSIS — M7542 Impingement syndrome of left shoulder: Secondary | ICD-10-CM | POA: Diagnosis not present

## 2019-03-20 DIAGNOSIS — M7542 Impingement syndrome of left shoulder: Secondary | ICD-10-CM | POA: Diagnosis not present

## 2019-05-02 DIAGNOSIS — M25512 Pain in left shoulder: Secondary | ICD-10-CM | POA: Diagnosis not present

## 2019-05-09 ENCOUNTER — Ambulatory Visit: Payer: BC Managed Care – PPO | Attending: Internal Medicine

## 2019-05-09 DIAGNOSIS — Z23 Encounter for immunization: Secondary | ICD-10-CM | POA: Insufficient documentation

## 2019-05-09 NOTE — Progress Notes (Signed)
   Covid-19 Vaccination Clinic  Name:  James Ballard    MRN: [id number] DOB: May 18, 1965  05/09/2019  [name] was observed post Covid-19 immunization for 15 minutes without incident. He was provided with Vaccine Information Sheet and instruction to access the V-Safe system.   [name] was instructed to call 911 with any severe reactions post vaccine: Marland Kitchen Difficulty breathing  . Swelling of face and throat  . A fast heartbeat  . A bad rash all over body  . Dizziness and weakness   Immunizations Administered    Name Date Dose VIS Date Route   Pfizer COVID-19 Vaccine 05/09/2019  5:15 PM 0.3 mL 02/15/2019 Intramuscular   Manufacturer: [place]   Lot: [id number]   [place]: [id number]

## 2019-06-11 ENCOUNTER — Ambulatory Visit: Payer: BC Managed Care – PPO | Attending: Internal Medicine

## 2019-06-11 DIAGNOSIS — Z23 Encounter for immunization: Secondary | ICD-10-CM

## 2019-06-11 NOTE — Progress Notes (Signed)
   Covid-19 Vaccination Clinic  Name:  JISHNU LANKFORD    MRN: [id number] DOB: 1965-04-11  06/11/2019  Mr. Valenti was observed post Covid-19 immunization for 15 minutes without incident. He was provided with Vaccine Information Sheet and instruction to access the V-Safe system.   Mr. Maiorana was instructed to call 911 with any severe reactions post vaccine: Marland Kitchen Difficulty breathing  . Swelling of face and throat  . A fast heartbeat  . A bad rash all over body  . Dizziness and weakness   Immunizations Administered    Name Date Dose VIS Date Route   Pfizer COVID-19 Vaccine 06/11/2019 12:58 PM 0.3 mL 02/15/2019 Intramuscular   Manufacturer: [place]   Lot: [id number]   [place]: [id number]

## 2019-08-12 ENCOUNTER — Other Ambulatory Visit: Payer: Self-pay

## 2019-08-12 ENCOUNTER — Encounter: Payer: Self-pay | Admitting: Podiatry

## 2019-08-12 ENCOUNTER — Ambulatory Visit (INDEPENDENT_AMBULATORY_CARE_PROVIDER_SITE_OTHER): Payer: BC Managed Care – PPO | Admitting: Podiatry

## 2019-08-12 DIAGNOSIS — M722 Plantar fascial fibromatosis: Secondary | ICD-10-CM | POA: Diagnosis not present

## 2019-08-12 MED ORDER — MELOXICAM 15 MG PO TABS: 15.0000 mg | ORAL_TABLET | Freq: Every day | ORAL | 1 refills | Status: DC

## 2019-08-12 MED ORDER — METHYLPREDNISOLONE 4 MG PO TBPK: ORAL_TABLET | ORAL | 0 refills | Status: DC

## 2019-08-12 NOTE — Progress Notes (Signed)
   Subjective: 54 y.o. male presenting today with a chief complaint of intermittent sharp pain of the right heel.  Patient was last seen in the office on 12/12/2018.  At that time he declined injections and was given oral prednisone and diclofenac.  Patient states that the pills did not help.  He has had pain since the last visit.  He did get a new pair of custom molded orthotics which he states do help minimally.  He wears them in all of his shoes.  He is very active and plays tennis and lately he has been unable to put pressure on his foot.  He presents today for further treatment evaluation   Past Medical History:  Diagnosis Date  . Hyperlipidemia    Objective: Physical Exam General: The patient is alert and oriented x3 in no acute distress.  Dermatology: Skin is warm, dry and supple bilateral lower extremities. Negative for open lesions or macerations bilateral.   Vascular: Dorsalis Pedis and Posterior Tibial pulses palpable bilateral.  Capillary fill time is immediate to all digits.  Neurological: Epicritic and protective threshold intact bilateral.   Musculoskeletal: Tenderness to palpation to the plantar aspect of the right heel along the plantar fascia. All other joints range of motion within normal limits bilateral. Strength 5/5 in all groups bilateral.   Assessment: 1. Plantar fasciitis right  Plan of Care:  1. Patient evaluated.    2.  Injection of 0.5 cc Celestone Soluspan injection of the right heel 3. Rx for Medrol Dose Pack placed 4. Rx for meloxicam 15 mg daily ordered for patient. 5. Continue using custom orthotics.  6.  Return to clinic as needed  Plays tennis, skateboards and boats.    Edrick Kins, DPM [facility]  Dr. Edrick Kins, DPM    [address], [place] [address]                Office [phone]  Fax [phone]

## 2019-09-11 ENCOUNTER — Encounter: Payer: Self-pay | Admitting: *Deleted

## 2019-09-20 ENCOUNTER — Ambulatory Visit (INDEPENDENT_AMBULATORY_CARE_PROVIDER_SITE_OTHER): Payer: BC Managed Care – PPO | Admitting: Physician Assistant

## 2019-09-20 ENCOUNTER — Encounter: Payer: Self-pay | Admitting: Physician Assistant

## 2019-09-20 ENCOUNTER — Other Ambulatory Visit: Payer: Self-pay

## 2019-09-20 DIAGNOSIS — L570 Actinic keratosis: Secondary | ICD-10-CM | POA: Diagnosis not present

## 2019-09-20 DIAGNOSIS — Z1283 Encounter for screening for malignant neoplasm of skin: Secondary | ICD-10-CM

## 2019-09-20 DIAGNOSIS — L814 Other melanin hyperpigmentation: Secondary | ICD-10-CM | POA: Diagnosis not present

## 2019-09-20 DIAGNOSIS — L985 Mucinosis of the skin: Secondary | ICD-10-CM | POA: Diagnosis not present

## 2019-09-20 DIAGNOSIS — D485 Neoplasm of uncertain behavior of skin: Secondary | ICD-10-CM | POA: Diagnosis not present

## 2019-09-20 DIAGNOSIS — D1801 Hemangioma of skin and subcutaneous tissue: Secondary | ICD-10-CM | POA: Diagnosis not present

## 2019-09-20 NOTE — Patient Instructions (Signed)

## 2019-09-20 NOTE — Progress Notes (Signed)
   New Patient   Subjective  James Ballard is a 54 y.o. male who presents for the following: Annual Exam (right side of nose x 6 months-wont heal, no itch, no bleed).    The following portions of the chart were reviewed this encounter and updated as appropriate:     Objective  Well appearing patient in no apparent distress; mood and affect are within normal limits.  All skin waist up examined.  Objective  Dorsum of Nose: Pearly papule with telangectasia.      Objective  Left Lower Leg - Anterior: Soft papule with crust       Objective  Left Abdomen (side) - Upper (2), Right Abdomen (side) - Upper (3): Red papule   Assessment & Plan  Screening exam for skin cancer Head - Anterior (Face)  Neoplasm of uncertain behavior of skin (2) Dorsum of Nose  Skin / nail biopsy Type of biopsy: tangential   Informed consent: discussed and consent obtained   Timeout: patient name, date of birth, surgical site, and procedure verified   Anesthesia: the lesion was anesthetized in a standard fashion   Anesthetic:  1% lidocaine w/ epinephrine 1-100,000 local infiltration Instrument used: flexible razor blade   Hemostasis achieved with: aluminum chloride and electrodesiccation   Outcome: patient tolerated procedure well   Post-procedure details: wound care instructions given    Specimen 1 - Surgical pathology Differential Diagnosis: bcc vs scc Check Margins: No  Left Lower Leg - Anterior  Skin / nail biopsy Type of biopsy: tangential   Informed consent: discussed and consent obtained   Timeout: patient name, date of birth, surgical site, and procedure verified   Anesthesia: the lesion was anesthetized in a standard fashion   Anesthetic:  1% lidocaine w/ epinephrine 1-100,000 local infiltration Instrument used: flexible razor blade   Hemostasis achieved with: aluminum chloride and electrodesiccation   Outcome: patient tolerated procedure well   Post-procedure details:  wound care instructions given    Specimen 2 - Surgical pathology Differential Diagnosis: bcc vs scc Check Margins: No  Hemangioma of skin (5) Left Abdomen (side) - Upper (2); Right Abdomen (side) - Upper (3)  observe  Lentigines - Scattered tan macules - Discussed due to sun exposure - Benign, observe - Call for any changes

## 2019-10-07 ENCOUNTER — Ambulatory Visit: Payer: BC Managed Care – PPO | Admitting: Podiatry

## 2019-10-28 ENCOUNTER — Ambulatory Visit (INDEPENDENT_AMBULATORY_CARE_PROVIDER_SITE_OTHER): Payer: BC Managed Care – PPO | Admitting: Podiatry

## 2019-10-28 ENCOUNTER — Other Ambulatory Visit: Payer: Self-pay

## 2019-10-28 DIAGNOSIS — M722 Plantar fascial fibromatosis: Secondary | ICD-10-CM

## 2019-10-28 NOTE — Progress Notes (Signed)
   Subjective: 54 y.o. male presenting today for follow-up evaluation of pain and tenderness to the right heel consistent with plantar fasciitis.  Patient states that the pain has recurred.  He has been doing with this off and on for the past few years now.  He is very active however his heel is becoming increasingly painful.  He presents for further treatment evaluation  Past Medical History:  Diagnosis Date  . Hyperlipidemia    Objective: Physical Exam General: The patient is alert and oriented x3 in no acute distress.  Dermatology: Skin is warm, dry and supple bilateral lower extremities. Negative for open lesions or macerations bilateral.   Vascular: Dorsalis Pedis and Posterior Tibial pulses palpable bilateral.  Capillary fill time is immediate to all digits.  Neurological: Epicritic and protective threshold intact bilateral.   Musculoskeletal: Tenderness to palpation to the plantar aspect of the right heel along the plantar fascia. All other joints range of motion within normal limits bilateral. Strength 5/5 in all groups bilateral.   Assessment: 1. Plantar fasciitis right  Plan of Care:  1. Patient evaluated.    2.  Injection of 0.5 cc Celestone Soluspan injection of the right heel 3.  Patient states the Medrol Dosepak and meloxicam did not really provide any sort of alleviation of symptoms. 4.  Continue custom molded orthotics 5.  Today we did discuss the possibility of surgery.  The patient has had this pain for greater than 2 years now despite conservative treatment modalities.  I do believe that endoscopic plantar fasciotomy may be more definitive solution for the patient.  Patient states that he would like to consider it for the wintertime 6.  Return to clinic as needed  Plays tennis, skateboards and boats.    Edrick Kins, DPM [facility]  Dr. Edrick Kins, DPM    [address], [place] [address]                 Office [phone]  Fax [phone]

## 2019-11-06 DIAGNOSIS — R0600 Dyspnea, unspecified: Secondary | ICD-10-CM | POA: Diagnosis not present

## 2019-11-06 DIAGNOSIS — K5904 Chronic idiopathic constipation: Secondary | ICD-10-CM | POA: Diagnosis not present

## 2019-11-06 DIAGNOSIS — M810 Age-related osteoporosis without current pathological fracture: Secondary | ICD-10-CM | POA: Diagnosis not present

## 2019-11-06 DIAGNOSIS — E559 Vitamin D deficiency, unspecified: Secondary | ICD-10-CM | POA: Diagnosis not present

## 2019-11-06 DIAGNOSIS — K219 Gastro-esophageal reflux disease without esophagitis: Secondary | ICD-10-CM | POA: Diagnosis not present

## 2019-11-06 DIAGNOSIS — R739 Hyperglycemia, unspecified: Secondary | ICD-10-CM | POA: Diagnosis not present

## 2019-11-06 DIAGNOSIS — E7211 Homocystinuria: Secondary | ICD-10-CM | POA: Diagnosis not present

## 2019-11-15 DIAGNOSIS — R1084 Generalized abdominal pain: Secondary | ICD-10-CM | POA: Diagnosis not present

## 2019-11-15 DIAGNOSIS — K2960 Other gastritis without bleeding: Secondary | ICD-10-CM | POA: Diagnosis not present

## 2019-12-02 ENCOUNTER — Telehealth: Payer: Self-pay

## 2019-12-02 NOTE — Telephone Encounter (Signed)
Pt has received a few injection for plantar fasciitis. Pt would like a referral for an MRI of the Right heel before moving into the surgical direction. Please advise.

## 2019-12-03 ENCOUNTER — Telehealth: Payer: Self-pay

## 2019-12-03 NOTE — Telephone Encounter (Signed)
Patient called requesting to have an MRI of right heel done before scheduling surgery.  Please advise if we can go ahead and schedule.   This is a [facility] patient

## 2019-12-06 ENCOUNTER — Telehealth: Payer: Self-pay | Admitting: Podiatry

## 2019-12-06 NOTE — Telephone Encounter (Signed)
Patient called requesting to have an MRI of right heel done before scheduling surgery.  Please advise if we can go ahead and schedule., this is his 3rd time calling this week

## 2019-12-12 ENCOUNTER — Telehealth: Payer: Self-pay | Admitting: Podiatrist

## 2019-12-12 ENCOUNTER — Encounter: Payer: Self-pay | Admitting: Podiatry

## 2019-12-12 ENCOUNTER — Other Ambulatory Visit: Payer: Self-pay | Admitting: Podiatrist

## 2019-12-12 DIAGNOSIS — M79671 Pain in right foot: Secondary | ICD-10-CM

## 2019-12-12 NOTE — Telephone Encounter (Signed)
Patient called asking to get an MRI scheduled for his right foot- states it is his 5th call.  I placed an order for MRI of right foot without contrast with dx. Of chronic pain right heel possible plantar fasitis at [facility].  I called the patient and let him know the order has been placed and gave the address and phone number of [facility].    He has an appt w Dr. Amalia Hailey on the 18th and wanted the MRI done prior to this visit so they could have the result to help for decision on surgery.

## 2019-12-14 ENCOUNTER — Ambulatory Visit
Admission: RE | Admit: 2019-12-14 | Discharge: 2019-12-14 | Disposition: A | Discharge status: Home / Self Care | Payer: BC Managed Care – PPO | Source: Ambulatory Visit | Attending: Podiatrist | Admitting: Podiatrist

## 2019-12-14 DIAGNOSIS — M6589 Other synovitis and tenosynovitis, multiple sites: Secondary | ICD-10-CM | POA: Diagnosis not present

## 2019-12-14 DIAGNOSIS — M79671 Pain in right foot: Secondary | ICD-10-CM

## 2019-12-14 DIAGNOSIS — M722 Plantar fascial fibromatosis: Secondary | ICD-10-CM | POA: Diagnosis not present

## 2019-12-22 ENCOUNTER — Other Ambulatory Visit: Payer: BC Managed Care – PPO

## 2019-12-23 ENCOUNTER — Ambulatory Visit: Payer: BC Managed Care – PPO | Admitting: Podiatry

## 2019-12-23 DIAGNOSIS — B960 Mycoplasma pneumoniae [M. pneumoniae] as the cause of diseases classified elsewhere: Secondary | ICD-10-CM | POA: Diagnosis not present

## 2019-12-23 DIAGNOSIS — J160 Chlamydial pneumonia: Secondary | ICD-10-CM | POA: Diagnosis not present

## 2020-01-15 ENCOUNTER — Other Ambulatory Visit: Payer: Self-pay

## 2020-01-15 ENCOUNTER — Ambulatory Visit (INDEPENDENT_AMBULATORY_CARE_PROVIDER_SITE_OTHER): Payer: BC Managed Care – PPO | Admitting: Podiatry

## 2020-01-15 DIAGNOSIS — M722 Plantar fascial fibromatosis: Secondary | ICD-10-CM

## 2020-01-15 NOTE — Progress Notes (Signed)
   Subjective: 54 y.o. male presenting today for follow-up evaluation of plantar fasciitis to the right heel.  Patient continues to have severe right heel pain for over 2 years now.  So far conservative modalities of been unsuccessful to provide any sort of lasting alleviation.  Past Medical History:  Diagnosis Date  . Hyperlipidemia    Objective: Physical Exam General: The patient is alert and oriented x3 in no acute distress.  Dermatology: Skin is warm, dry and supple bilateral lower extremities. Negative for open lesions or macerations bilateral.   Vascular: Dorsalis Pedis and Posterior Tibial pulses palpable bilateral.  Capillary fill time is immediate to all digits.  Neurological: Epicritic and protective threshold intact bilateral.   Musculoskeletal: Tenderness to palpation to the plantar aspect of the right heel along the plantar fascia. All other joints range of motion within normal limits bilateral. Strength 5/5 in all groups bilateral.   MRI impression 12/14/2019: 1. Mild plantar fasciitis. No tear. 2. Mild posterior tibial tenosynovitis.  Assessment: 1. Plantar fasciitis right  Plan of Care:  1. Patient evaluated.   Second opinion from the other physician was reviewed today.  There was a letter provided from the patient regarding a second opinion.  Second opinion included PRP with tibial nerve block and shockwave.  I personally would recommend stem cell over PRP.  There is growing evidence for both treatment modalities 2. Discussed different treatment options including PRP, stem cell injections, EPAT/shockwave treatments.  We discussed the benefits and disadvantages of each procedure and treatment modality. 3.  Together we decided that we will pursue shockwave treatments.  4.  Appointment with nurse for EPAT/shockwave 5.  I also do believe the patient would benefit from surgical EPF.  Although the MRI shows only mild plantar fascitis with thickening, the patient has had this  for over 2 years with minimal improvement.  It is very debilitating on a daily basis.  Minimally invasive endoscopic plantar fasciotomy, I feel would provide permanent lasting relief for the patient  6.  Return to clinic with me in 2 months  Plays tennis, skateboards and boats.    Edrick Kins, DPM [facility]  Dr. Edrick Kins, DPM    [address], [place] [address]                Office [phone]  Fax [phone]

## 2020-01-17 ENCOUNTER — Ambulatory Visit (INDEPENDENT_AMBULATORY_CARE_PROVIDER_SITE_OTHER): Payer: BC Managed Care – PPO | Admitting: *Deleted

## 2020-01-17 ENCOUNTER — Other Ambulatory Visit: Payer: Self-pay

## 2020-01-17 DIAGNOSIS — M722 Plantar fascial fibromatosis: Secondary | ICD-10-CM

## 2020-01-17 DIAGNOSIS — B351 Tinea unguium: Secondary | ICD-10-CM

## 2020-01-17 NOTE — Progress Notes (Signed)
Patient presents for the 1st EPAT treatment today with complaint of plantar heel pain right. Diagnosed with plantar fasciitis by Dr. Amalia Hailey. This has been ongoing for several months. The patient has tried ice, stretching, NSAIDS and supportive shoe gear with no long term relief.   Most of the pain is located plantar/medial heel RIGHT.  ESWT administered and tolerated well.Treatment settings initiated at:   Energy: 12  Ended treatment session today with 3000 shocks at the following settings:   Energy: 12  Frequency: 6.0  Joules: 11.77   Reviewed post EPAT instructions. Advised to avoid ice and NSAIDs throughout the treatment process and to utilize boot or supportive shoes for at least the next 3 days.  Follow up for 2nd treatment in 1 week.

## 2020-01-17 NOTE — Patient Instructions (Signed)

## 2020-01-24 ENCOUNTER — Ambulatory Visit (INDEPENDENT_AMBULATORY_CARE_PROVIDER_SITE_OTHER): Payer: BC Managed Care – PPO | Admitting: *Deleted

## 2020-01-24 ENCOUNTER — Other Ambulatory Visit: Payer: Self-pay

## 2020-01-24 DIAGNOSIS — M722 Plantar fascial fibromatosis: Secondary | ICD-10-CM

## 2020-01-24 DIAGNOSIS — B351 Tinea unguium: Secondary | ICD-10-CM

## 2020-01-24 NOTE — Progress Notes (Signed)
Patient presents for the 2nd EPAT treatment today with complaint of plantar heel pain right. Diagnosed with plantar fasciitis by Dr. Amalia Hailey. This has been ongoing for several months. The patient has tried ice, stretching, NSAIDS and supportive shoe gear with no long term relief.   Most of the pain is located plantar/medial heel and some posterior heel RIGHT. He is doing much better already and is very optimistic about treatment.  ESWT administered and tolerated well.Treatment settings initiated at:   Energy: 15  Ended treatment session today with 3000 shocks at the following settings:   Energy: 15  Frequency: 6.0  Joules: 14.72   Reviewed post EPAT instructions. Advised to avoid ice and NSAIDs throughout the treatment process and to utilize boot or supportive shoes for at least the next 3 days.  Follow up for 3rd treatment in 1 week.

## 2020-02-07 ENCOUNTER — Ambulatory Visit (INDEPENDENT_AMBULATORY_CARE_PROVIDER_SITE_OTHER): Payer: BC Managed Care – PPO | Admitting: *Deleted

## 2020-02-07 ENCOUNTER — Other Ambulatory Visit: Payer: Self-pay

## 2020-02-07 DIAGNOSIS — M722 Plantar fascial fibromatosis: Secondary | ICD-10-CM

## 2020-02-07 DIAGNOSIS — B351 Tinea unguium: Secondary | ICD-10-CM

## 2020-02-07 NOTE — Progress Notes (Signed)
Patient presents for the 3rd EPAT treatment today with complaint of plantar heel pain right. Diagnosed with plantar fasciitis by Dr. Amalia Hailey. This has been ongoing for several months. The patient has tried ice, stretching, NSAIDS and supportive shoe gear with no long term relief.   Most of the pain is located more central plantar heel and some posterior heel RIGHT. He's had a little more pain this week than before.  ESWT administered and tolerated well.Treatment settings initiated at:   Energy: 20  Ended treatment session today with 3000 shocks at the following settings:   Energy: 20  Frequency: 5.0  Joules: 19.62   Reviewed post EPAT instructions. Advised to avoid ice and NSAIDs throughout the treatment process and to utilize boot or supportive shoes for at least the next 3 days.  Follow up for 4th treatment in 2-3 weeks

## 2020-02-13 DIAGNOSIS — E291 Testicular hypofunction: Secondary | ICD-10-CM | POA: Diagnosis not present

## 2020-02-13 DIAGNOSIS — Z79890 Hormone replacement therapy (postmenopausal): Secondary | ICD-10-CM | POA: Diagnosis not present

## 2020-02-13 DIAGNOSIS — M810 Age-related osteoporosis without current pathological fracture: Secondary | ICD-10-CM | POA: Diagnosis not present

## 2020-02-17 ENCOUNTER — Other Ambulatory Visit: Payer: Self-pay

## 2020-02-17 ENCOUNTER — Ambulatory Visit (INDEPENDENT_AMBULATORY_CARE_PROVIDER_SITE_OTHER): Payer: BC Managed Care – PPO | Admitting: *Deleted

## 2020-02-17 DIAGNOSIS — M722 Plantar fascial fibromatosis: Secondary | ICD-10-CM

## 2020-02-17 DIAGNOSIS — B351 Tinea unguium: Secondary | ICD-10-CM

## 2020-02-17 NOTE — Progress Notes (Signed)
Patient presents for the 4th EPAT treatment today with complaint of plantar heel pain right. Diagnosed with plantar fasciitis by Dr. Amalia Hailey. This has been ongoing for several months. The patient has tried ice, stretching, NSAIDS and supportive shoe gear with no long term relief.   Most of the pain is located more central plantar heel and some posterior heel RIGHT. He is feeling significantly better today.  ESWT administered and tolerated well.Treatment settings initiated at:   Energy: 25  Ended treatment session today with 3000 shocks at the following settings:   Energy: 25  Frequency: 4.0  Joules: 24.52   Reviewed post EPAT instructions. Advised to avoid ice and NSAIDs throughout the treatment process and to utilize boot or supportive shoes for at least the next 3 days.  Follow up with Dr. Amalia Hailey in 4 weeks for re-evaluation.

## 2020-03-16 ENCOUNTER — Ambulatory Visit: Payer: BC Managed Care – PPO | Admitting: Podiatry

## 2020-04-06 ENCOUNTER — Ambulatory Visit (INDEPENDENT_AMBULATORY_CARE_PROVIDER_SITE_OTHER): Payer: BC Managed Care – PPO | Admitting: Podiatry

## 2020-04-06 ENCOUNTER — Other Ambulatory Visit: Payer: Self-pay

## 2020-04-06 DIAGNOSIS — M722 Plantar fascial fibromatosis: Secondary | ICD-10-CM

## 2020-04-06 NOTE — Progress Notes (Signed)
   Subjective: 55 y.o. male presenting today for follow-up evaluation of plantar fasciitis to the right heel.  Patient has completed all of his EPAT sessions with our nurse here in the office.  He states minimal improvement.  He presents for further treatment evaluation  Past Medical History:  Diagnosis Date  . Hyperlipidemia    Objective: Physical Exam General: The patient is alert and oriented x3 in no acute distress.  Dermatology: Skin is warm, dry and supple bilateral lower extremities. Negative for open lesions or macerations bilateral.   Vascular: Dorsalis Pedis and Posterior Tibial pulses palpable bilateral.  Capillary fill time is immediate to all digits.  Neurological: Epicritic and protective threshold intact bilateral.   Musculoskeletal: Tenderness to palpation to the plantar aspect of the right heel along the plantar fascia. All other joints range of motion within normal limits bilateral. Strength 5/5 in all groups bilateral.   MRI impression 12/14/2019: 1. Mild plantar fasciitis. No tear. 2. Mild posterior tibial tenosynovitis.  Assessment: 1. Plantar fasciitis right  Plan of Care:  1. Patient evaluated.   Second opinion from the other physician was reviewed prior to EPAT therapy.  There was a letter provided from the patient regarding a second opinion.  Second opinion included PRP with tibial nerve block and shockwave.  I personally would recommend stem cell over PRP.  There is growing evidence for both treatment modalities 2. Discussed again today different treatment options including PRP, stem cell injections, EPAT/shockwave treatments.  We discussed the benefits and disadvantages of each procedure and treatment modality. 3.  At this point I do not really recommend surgical EPF.  The patient has dealt with this pain for over 3 years now.  Patient would like to go home and think about it.  Return to clinic as needed  Plays tennis, skateboards and boats.    Edrick Kins, DPM [facility]  Dr. Edrick Kins, DPM    [address], [place] [address]                Office [phone]  Fax [phone]

## 2020-05-18 DIAGNOSIS — M3119 Other thrombotic microangiopathy: Secondary | ICD-10-CM | POA: Diagnosis not present

## 2020-05-18 DIAGNOSIS — M810 Age-related osteoporosis without current pathological fracture: Secondary | ICD-10-CM | POA: Diagnosis not present

## 2020-05-18 DIAGNOSIS — R739 Hyperglycemia, unspecified: Secondary | ICD-10-CM | POA: Diagnosis not present

## 2020-05-18 DIAGNOSIS — M3581 Multisystem inflammatory syndrome: Secondary | ICD-10-CM | POA: Diagnosis not present

## 2020-05-18 DIAGNOSIS — E559 Vitamin D deficiency, unspecified: Secondary | ICD-10-CM | POA: Diagnosis not present

## 2020-05-18 DIAGNOSIS — E291 Testicular hypofunction: Secondary | ICD-10-CM | POA: Diagnosis not present

## 2020-05-18 DIAGNOSIS — E7211 Homocystinuria: Secondary | ICD-10-CM | POA: Diagnosis not present

## 2020-06-09 DIAGNOSIS — M25521 Pain in right elbow: Secondary | ICD-10-CM | POA: Diagnosis not present

## 2020-06-09 DIAGNOSIS — M79601 Pain in right arm: Secondary | ICD-10-CM | POA: Diagnosis not present

## 2020-06-10 ENCOUNTER — Other Ambulatory Visit: Payer: Self-pay | Admitting: Family Medicine

## 2020-06-10 DIAGNOSIS — M25521 Pain in right elbow: Secondary | ICD-10-CM

## 2020-06-15 DIAGNOSIS — R070 Pain in throat: Secondary | ICD-10-CM | POA: Diagnosis not present

## 2020-06-29 ENCOUNTER — Other Ambulatory Visit (HOSPITAL_BASED_OUTPATIENT_CLINIC_OR_DEPARTMENT_OTHER): Payer: Self-pay

## 2020-06-29 DIAGNOSIS — R0683 Snoring: Secondary | ICD-10-CM

## 2020-07-01 ENCOUNTER — Ambulatory Visit
Admission: RE | Admit: 2020-07-01 | Discharge: 2020-07-01 | Disposition: A | Discharge status: Home / Self Care | Payer: BC Managed Care – PPO | Source: Ambulatory Visit | Attending: Family Medicine | Admitting: Family Medicine

## 2020-07-01 DIAGNOSIS — M25521 Pain in right elbow: Secondary | ICD-10-CM

## 2020-07-01 DIAGNOSIS — M79601 Pain in right arm: Secondary | ICD-10-CM | POA: Diagnosis not present

## 2020-07-06 DIAGNOSIS — M79601 Pain in right arm: Secondary | ICD-10-CM | POA: Diagnosis not present

## 2020-07-16 DIAGNOSIS — M7711 Lateral epicondylitis, right elbow: Secondary | ICD-10-CM | POA: Diagnosis not present

## 2020-07-16 DIAGNOSIS — M6289 Other specified disorders of muscle: Secondary | ICD-10-CM | POA: Diagnosis not present

## 2020-07-22 ENCOUNTER — Ambulatory Visit (HOSPITAL_BASED_OUTPATIENT_CLINIC_OR_DEPARTMENT_OTHER): Payer: BC Managed Care – PPO | Attending: Otolaryngology | Admitting: Internal Medicine

## 2020-07-22 ENCOUNTER — Other Ambulatory Visit: Payer: Self-pay

## 2020-07-22 DIAGNOSIS — G4733 Obstructive sleep apnea (adult) (pediatric): Secondary | ICD-10-CM | POA: Diagnosis not present

## 2020-07-22 DIAGNOSIS — G4736 Sleep related hypoventilation in conditions classified elsewhere: Secondary | ICD-10-CM | POA: Diagnosis not present

## 2020-07-22 DIAGNOSIS — R0902 Hypoxemia: Secondary | ICD-10-CM | POA: Insufficient documentation

## 2020-07-22 DIAGNOSIS — R0683 Snoring: Secondary | ICD-10-CM

## 2020-07-25 DIAGNOSIS — R0683 Snoring: Secondary | ICD-10-CM

## 2020-07-25 NOTE — Procedures (Signed)
   Patient Name: James Ballard, James Ballard Date: 07/22/2020 Gender: Male D.O.B: 1965-07-02 Age (years): 49 Referring Provider: Leta Baptist Height (inches): 74 Interpreting Physician: Baird Lyons MD, ABSM Weight (lbs): 198 RPSGT: James Ballard BMI: 25 MRN: [id number] Neck Size:   CLINICAL INFORMATION Sleep Study Type: HST Indication for sleep study: OSA Epworth Sleepiness Score: 1  SLEEP STUDY TECHNIQUE A multi-channel overnight portable sleep study was performed. The channels recorded were: nasal airflow, thoracic respiratory movement, and oxygen saturation with a pulse oximetry. Snoring was also monitored.  MEDICATIONS Patient self administered medications include: none reported.  SLEEP ARCHITECTURE Patient was studied for 366.2 minutes. The sleep efficiency was 100.0 % and the patient was supine for 82.9%. The arousal index was 0.0 per hour.  RESPIRATORY PARAMETERS The overall AHI was 67.3 per hour, with a central apnea index of 0 per hour. The oxygen nadir was 78% during sleep.  CARDIAC DATA Mean heart rate during sleep was 62.8 bpm.  IMPRESSIONS - Severe obstructive sleep apnea occurred during this study (AHI = 67.3/h). - Oxygen desaturation was noted during this study (Min O2 = 78%). Mean O2 saturation 93%. - Time with O2 saturation 89% or less was 15.5 minutes. - Patient snored.  DIAGNOSIS - Obstructive Sleep Apnea (G47.33) - Nocturnal Hypoxemia (G47.36)  RECOMMENDATIONS - Suggest CPAP titration sleep study or autopap. Other options would based on clinical judgment. - Be careful with alcohol, sedatives and other CNS depressants that may worsen sleep apnea and disrupt normal sleep architecture. - Sleep hygiene should be reviewed to assess factors that may improve sleep quality. - Weight management and regular exercise should be initiated or continued.  [Electronically signed] 07/25/2020 11:14 AM  Baird Lyons MD, ABSM Diplomate, American Board of Sleep  Medicine   NPI: [id number]                          [place] [place] [facility]  ELECTRONICALLY SIGNED ON:  07/25/2020, 11:15 AM James Ballard PH: [phone]   FX: [phone] [place]

## 2020-07-27 DIAGNOSIS — G4733 Obstructive sleep apnea (adult) (pediatric): Secondary | ICD-10-CM | POA: Diagnosis not present

## 2020-07-27 DIAGNOSIS — R070 Pain in throat: Secondary | ICD-10-CM | POA: Diagnosis not present

## 2020-08-14 DIAGNOSIS — M810 Age-related osteoporosis without current pathological fracture: Secondary | ICD-10-CM | POA: Diagnosis not present

## 2020-08-14 DIAGNOSIS — E561 Deficiency of vitamin K: Secondary | ICD-10-CM | POA: Diagnosis not present

## 2020-08-14 DIAGNOSIS — Z79890 Hormone replacement therapy (postmenopausal): Secondary | ICD-10-CM | POA: Diagnosis not present

## 2020-08-14 DIAGNOSIS — E559 Vitamin D deficiency, unspecified: Secondary | ICD-10-CM | POA: Diagnosis not present

## 2020-08-14 DIAGNOSIS — I10 Essential (primary) hypertension: Secondary | ICD-10-CM | POA: Diagnosis not present

## 2020-08-14 DIAGNOSIS — E291 Testicular hypofunction: Secondary | ICD-10-CM | POA: Diagnosis not present

## 2020-08-18 NOTE — Progress Notes (Signed)
08/19/20- 54 yoM never smoker for sleep evaluation with concern of OSA Medical problem list includes Hyperlipidemia,  HST ([facility]/ Dr Benjamine Mola) 07/22/20- AHI 67.3/ hr, desaturation to 78% with 15 minutes </= 89%, body weight 198 lbs Epworth score-0 Body weight today- 204 lbs Covid vax-3 Phizer He and wife separate rooms due to his snoring. Questions weight gain since starting testosterone supplements.  Minimizes daytime sleepiness. No naps. Tea and doet coke.  ENT surgery+adenoids. Father+ OSA, AFib. Dr Benjamine Mola suggested gabapentin for habitual throat clearing.   Prior to Admission medications   Medication Sig Start Date End Date Taking? Authorizing Provider  diclofenac (VOLTAREN) 75 MG EC tablet Take 1 tablet (75 mg total) by mouth 2 (two) times daily. 12/12/18  Yes Edrick Kins, DPM  esomeprazole (NEXIUM) 20 MG capsule  07/24/18  Yes [provider]  omeprazole (PRILOSEC) 10 MG capsule Take 10 mg by mouth daily.   Yes [provider]  ZOLMitriptan (ZOMIG PO) Take by mouth as needed.   Yes [provider]  diclofenac (FLECTOR) 1.3 % PTCH Apply topically. Patient not taking: Reported on 08/19/2020 09/14/17   [provider]  doxycycline (ADOXA) 100 MG tablet Take 200 mg by mouth once. Patient not taking: Reported on 08/19/2020 07/20/19   [provider]  meclizine (ANTIVERT) 25 MG tablet Take 25 mg by mouth daily as needed. Patient not taking: Reported on 08/19/2020 07/20/19   [provider]  meloxicam (MOBIC) 15 MG tablet Take 1 tablet (15 mg total) by mouth daily. Patient not taking: No sig reported 08/12/19   Edrick Kins, DPM  methylPREDNISolone (MEDROL DOSEPAK) 4 MG TBPK tablet 6 day dose pack - take as directed Patient not taking: No sig reported 08/12/19   Edrick Kins, DPM   Past Medical History:  Diagnosis Date   Hyperlipidemia    Past Surgical History:  Procedure Laterality Date   HEMORROIDECTOMY     KNEE ARTHROSCOPY     left    sesmoid fx- excised     ULNAR NERVE TRANSPOSITION     Family History  Problem Relation Age of Onset   Heart disease Father        pacemaker--has been removed-may have never needed it   Migraines Sister    Hyperlipidemia Brother    Migraines Brother    Social History   Socioeconomic History   Marital status: Married    Spouse name: Not on file   Number of children: Not on file   Years of education: Not on file   Highest education level: Not on file  Occupational History   Not on file  Tobacco Use   Smoking status: Never   Smokeless tobacco: Never  Substance and Sexual Activity   Alcohol use: Yes    Comment: occasionally   Drug use: No   Sexual activity: Not on file  Other Topics Concern   Not on file  Social History Narrative   Not on file   Social Determinants of Health   Financial Resource Strain: Not on file  Food Insecurity: Not on file  Transportation Needs: Not on file  Physical Activity: Not on file  Stress: Not on file  Social Connections: Not on file  Intimate Partner Violence: Not on file   ROS-see HPI   + = positive Constitutional:    weight loss, night sweats, fevers, chills, fatigue, lassitude. HEENT:    +headaches, difficulty swallowing, tooth/dental problems, sore throat,       sneezing, itching, ear  ache, nasal congestion, post nasal drip, snoring CV:    chest pain, orthopnea, PND, swelling in lower extremities, anasarca,                                   dizziness, palpitations Resp:   shortness of breath with exertion or at rest.                productive cough,   non-productive cough, coughing up of blood.              change in color of mucus.  wheezing.   Skin:    rash or lesions. GI: + heartburn, indigestion, abdominal pain, nausea, vomiting, diarrhea,                 change in bowel habits, loss of appetite GU: dysuria, change in color of urine, no urgency or frequency.   flank pain. MS:   joint pain, stiffness, decreased range of motion,  back pain. Neuro-     nothing unusual Psych:  change in mood or affect.  depression or anxiety.   memory loss.  OBJ- Physical Exam General- Alert, Oriented, Affect-appropriate, Distress- none acute, + tall/ not obese Skin- rash-none, lesions- none, excoriation- none Lymphadenopathy- none Head- atraumatic            Eyes- Gross vision intact, PERRLA, conjunctivae and secretions clear            Ears- Hearing, canals-normal            Nose- Clear, no-Septal dev, mucus, polyps, erosion, perforation             Throat- Mallampati II I, +prominent uvula,, mucosa clear , drainage- none, tonsils- atrophic, + teeth Neck- flexible , trachea midline, no stridor , thyroid nl, carotid no bruit Chest - symmetrical excursion , unlabored           Heart/CV- RRR , no murmur , no gallop  , no rub, nl s1 s2                           - JVD- none , edema- none, stasis changes- none, varices- none           Lung- clear to P&A, wheeze- none, cough- none , dullness-none, rub- none           Chest wall-  Abd-  Br/ Gen/ Rectal- Not done, not indicated Extrem- cyanosis- none, clubbing, none, atrophy- none, strength- nl Neuro- grossly intact to observation

## 2020-08-19 ENCOUNTER — Ambulatory Visit (INDEPENDENT_AMBULATORY_CARE_PROVIDER_SITE_OTHER): Payer: BC Managed Care – PPO | Admitting: Internal Medicine

## 2020-08-19 ENCOUNTER — Other Ambulatory Visit: Payer: Self-pay

## 2020-08-19 ENCOUNTER — Encounter: Payer: Self-pay | Admitting: Internal Medicine

## 2020-08-19 VITALS — BP 132/78 | HR 71 | Temp 98.6°F | Ht 74.0 in | Wt 204.4 lb

## 2020-08-19 DIAGNOSIS — G4733 Obstructive sleep apnea (adult) (pediatric): Secondary | ICD-10-CM | POA: Diagnosis not present

## 2020-08-19 DIAGNOSIS — R0683 Snoring: Secondary | ICD-10-CM

## 2020-08-19 DIAGNOSIS — R059 Cough, unspecified: Secondary | ICD-10-CM | POA: Diagnosis not present

## 2020-08-19 NOTE — Patient Instructions (Signed)
Order- new DME, new CPAP auto 5-20, mask of choice,  humidifier, supplies, AirView/ card  Please call as needed

## 2020-08-22 ENCOUNTER — Encounter: Payer: Self-pay | Admitting: Internal Medicine

## 2020-08-22 DIAGNOSIS — G4733 Obstructive sleep apnea (adult) (pediatric): Secondary | ICD-10-CM | POA: Insufficient documentation

## 2020-08-22 NOTE — Assessment & Plan Note (Signed)
Trying gabapentin for throat clearing and gives hx heartburn.  These may be basis for cough. Plan- reflux precautions.

## 2020-08-22 NOTE — Assessment & Plan Note (Addendum)
Significant issue at home. Dr Benjamine Mola didn't suggest surgery at this stage. Appropriate discussion. Plan-CPAP

## 2020-08-28 DIAGNOSIS — M6289 Other specified disorders of muscle: Secondary | ICD-10-CM | POA: Diagnosis not present

## 2020-08-28 DIAGNOSIS — M7711 Lateral epicondylitis, right elbow: Secondary | ICD-10-CM | POA: Diagnosis not present

## 2020-10-05 DIAGNOSIS — G4733 Obstructive sleep apnea (adult) (pediatric): Secondary | ICD-10-CM | POA: Diagnosis not present

## 2020-10-16 ENCOUNTER — Telehealth: Payer: Self-pay | Admitting: Internal Medicine

## 2020-10-16 DIAGNOSIS — G4733 Obstructive sleep apnea (adult) (pediatric): Secondary | ICD-10-CM

## 2020-10-16 NOTE — Telephone Encounter (Signed)
Call made to patient, confirmed DOB. Patient reports he keeps having leaks with every cpap mask he gets. He feels like the machine pressure is too high. He feels like it is blowing hard it is breaking the seal on the mask. It said it happens about the same time every night. He states he started out at 4 but once he sleeps in increases and his mask will not stay on. He is currently wearing a full face mask, wanting to know if CY thinks he may need to try to nasal pillows.   CY please advise. Patient aware there will be a delayed response.

## 2020-10-19 ENCOUNTER — Telehealth: Payer: Self-pay | Admitting: Internal Medicine

## 2020-10-19 NOTE — Addendum Note (Signed)
Addended by: Amado Coe on: 10/19/2020 11:29 AM   Modules accepted: Orders

## 2020-10-19 NOTE — Telephone Encounter (Signed)
I have called and spoke with pt and he is aware of order that has been sent to [facility]>  nothing further is needed.

## 2020-10-19 NOTE — Telephone Encounter (Signed)
See other phone note.  This has been completed.  

## 2020-10-19 NOTE — Telephone Encounter (Signed)
ATC patient unable to reach  Left message to call back  Order pending for when patient calls back

## 2020-10-19 NOTE — Addendum Note (Signed)
Addended by: Elie Confer on: 10/19/2020 07:27 PM   Modules accepted: Orders

## 2020-10-19 NOTE — Telephone Encounter (Signed)
We will try reducing pressure  Please order DME [facility] reduce CPAP pressure range to 4-15

## 2020-11-02 DIAGNOSIS — G4733 Obstructive sleep apnea (adult) (pediatric): Secondary | ICD-10-CM | POA: Diagnosis not present

## 2020-11-05 DIAGNOSIS — G4733 Obstructive sleep apnea (adult) (pediatric): Secondary | ICD-10-CM | POA: Diagnosis not present

## 2020-11-12 ENCOUNTER — Telehealth: Payer: BC Managed Care – PPO | Admitting: Family Medicine

## 2020-11-12 DIAGNOSIS — Z1152 Encounter for screening for COVID-19: Secondary | ICD-10-CM | POA: Diagnosis not present

## 2020-11-12 DIAGNOSIS — Z9189 Other specified personal risk factors, not elsewhere classified: Secondary | ICD-10-CM | POA: Diagnosis not present

## 2020-11-12 DIAGNOSIS — J111 Influenza due to unidentified influenza virus with other respiratory manifestations: Secondary | ICD-10-CM | POA: Diagnosis not present

## 2020-12-05 DIAGNOSIS — G4733 Obstructive sleep apnea (adult) (pediatric): Secondary | ICD-10-CM | POA: Diagnosis not present

## 2020-12-22 ENCOUNTER — Ambulatory Visit: Payer: BC Managed Care – PPO | Admitting: Physician Assistant

## 2020-12-23 ENCOUNTER — Telehealth: Payer: Self-pay | Admitting: Internal Medicine

## 2020-12-23 DIAGNOSIS — G4733 Obstructive sleep apnea (adult) (pediatric): Secondary | ICD-10-CM

## 2020-12-23 NOTE — Telephone Encounter (Signed)
CY please advise.  Pt stated that he feels that his cpap pressure is still too high.  He stated that it blows too hard and he has a hard time keeping it on all night.      This is the order sent to [facility] on 10/19/2020  Please order DME [facility] reduce CPAP pressure range to 4-15 Pt would like to try the nose mask to see if this helps

## 2020-12-24 NOTE — Telephone Encounter (Signed)
He has severe sleep apnea. He may need a BIPAP machine.  Order- schedule CPAP / Bipap titration sleep study. He can call us for results about 2 weeks after it is done.

## 2020-12-24 NOTE — Telephone Encounter (Signed)
I have called the pt and he is aware of order placed.  Nothing further is needed.

## 2020-12-24 NOTE — Telephone Encounter (Signed)
Patient is returning phone call. Patient phone number is [phone].

## 2020-12-24 NOTE — Telephone Encounter (Signed)
I have placed the order for the titration study.  I have called the pt and LM On VM for him to call back.

## 2020-12-28 NOTE — Progress Notes (Signed)
08/19/20- 54 yoM never smoker for sleep evaluation with concern of OSA Medical problem list includes Hyperlipidemia,  HST ([facility]/ Dr Benjamine Mola) 07/22/20- AHI 67.3/ hr, desaturation to 78% with 15 minutes </= 89%, body weight 198 lbs Epworth score-0 Body weight today- 204 lbs Covid vax-3 Phizer He and wife separate rooms due to his snoring. Questions weight gain since starting testosterone supplements.  Minimizes daytime sleepiness. No naps. Tea and diet coke.  ENT surgery+adenoids. Father+ OSA, AFib. Dr Benjamine Mola suggested gabapentin for habitual throat clearing.   12/29/20- 80 yoM never smoker followed for OSA, complicated by hyperlipidemia, CPAP auto 4-15/ [facility]  AirSense 11/ AutoSet Download- compliance 17%- short and missed nights, AHI 0.6/ hr Body weight today-188 lbs Covid vax-3 Phizer Flu vax- -----Patient is waking up every night possibly due to the pressure being too high.  Pressure is ranging 5.8-9.4 and giving excellent control but feels too high- not expected with his severe baseline AHI of 67.3.  He has lost weight, trying to stay in shape for tennis. Tall and lean. We discussed trying fixed CPAP pressure 5. If that is uncomfortable, then a split night study to reassess baseline may be appropriate, with future options to consider OAP or ENT/ [facility].   ROS-see HPI   + = positive Constitutional:    weight loss, night sweats, fevers, chills, fatigue, lassitude. HEENT:    +headaches, difficulty swallowing, tooth/dental problems, sore throat,       sneezing, itching, ear ache, nasal congestion, post nasal drip, snoring CV:    chest pain, orthopnea, PND, swelling in lower extremities, anasarca,                                   dizziness, palpitations Resp:   shortness of breath with exertion or at rest.                productive cough,   non-productive cough, coughing up of blood.              change in color of mucus.  wheezing.   Skin:    rash or lesions. GI: + heartburn, indigestion,  abdominal pain, nausea, vomiting, diarrhea,                 change in bowel habits, loss of appetite GU: dysuria, change in color of urine, no urgency or frequency.   flank pain. MS:   joint pain, stiffness, decreased range of motion, back pain. Neuro-     nothing unusual Psych:  change in mood or affect.  depression or anxiety.   memory loss.  OBJ- Physical Exam General- Alert, Oriented, Affect-appropriate, Distress- none acute, + tall/ not obese Skin- rash-none, lesions- none, excoriation- none Lymphadenopathy- none Head- atraumatic            Eyes- Gross vision intact, PERRLA, conjunctivae and secretions clear            Ears- Hearing, canals-normal            Nose- Clear, no-Septal dev, mucus, polyps, erosion, perforation             Throat- Mallampati III, +prominent uvula,, mucosa clear , drainage- none, tonsils- atrophic, + teeth Neck- flexible , trachea midline, no stridor , thyroid nl, carotid no bruit Chest - symmetrical excursion , unlabored           Heart/CV- RRR , no murmur , no gallop  , no rub, nl  s1 s2                           - JVD- none , edema- none, stasis changes- none, varices- none           Lung- clear to P&A, wheeze- none, cough- none , dullness-none, rub- none           Chest wall-  Abd-  Br/ Gen/ Rectal- Not done, not indicated Extrem- cyanosis- none, clubbing, none, atrophy- none, strength- nl Neuro- grossly intact to observation

## 2020-12-29 ENCOUNTER — Ambulatory Visit (INDEPENDENT_AMBULATORY_CARE_PROVIDER_SITE_OTHER): Payer: BC Managed Care – PPO | Admitting: Internal Medicine

## 2020-12-29 ENCOUNTER — Encounter: Payer: Self-pay | Admitting: Internal Medicine

## 2020-12-29 ENCOUNTER — Other Ambulatory Visit: Payer: Self-pay

## 2020-12-29 ENCOUNTER — Encounter: Payer: Self-pay | Admitting: Physician Assistant

## 2020-12-29 ENCOUNTER — Ambulatory Visit (INDEPENDENT_AMBULATORY_CARE_PROVIDER_SITE_OTHER): Payer: BC Managed Care – PPO | Admitting: Physician Assistant

## 2020-12-29 DIAGNOSIS — L570 Actinic keratosis: Secondary | ICD-10-CM

## 2020-12-29 DIAGNOSIS — G4733 Obstructive sleep apnea (adult) (pediatric): Secondary | ICD-10-CM

## 2020-12-29 DIAGNOSIS — R059 Cough, unspecified: Secondary | ICD-10-CM

## 2020-12-29 MED ORDER — FLUOROURACIL 5 % EX CREA: TOPICAL_CREAM | CUTANEOUS | 0 refills | Status: DC

## 2020-12-29 NOTE — Patient Instructions (Addendum)
Order- D[facility] please change CPAP to 5 cwp   Please call if this isn't comfortable.   We will cancel the Cpap titration sleep study set for Dec 6.

## 2020-12-29 NOTE — Addendum Note (Signed)
Addended by: Elby Beck R on: 12/29/2020 12:05 PM   Modules accepted: Orders

## 2020-12-29 NOTE — Assessment & Plan Note (Signed)
Unexpectedly low pressure requirement if baseline AHI is correct. Plan- reduce CPAP pressure to 5. Then consider split night study, then referral for alternative treatments.

## 2020-12-29 NOTE — Assessment & Plan Note (Signed)
Not indicating as an active issue at this time.

## 2020-12-30 ENCOUNTER — Encounter: Payer: Self-pay | Admitting: Physician Assistant

## 2020-12-30 NOTE — Progress Notes (Signed)
   Follow-Up Visit   Subjective  James Ballard is a 55 y.o. male who presents for the following: Annual Exam (Skin check, patient stated there was a lesion on the right upper nose per chart previously ln2 no longer present ).   The following portions of the chart were reviewed this encounter and updated as appropriate:  Tobacco  Allergies  Meds  Problems  Med Hx  Surg Hx  Fam Hx      Objective  Well appearing patient in no apparent distress; mood and affect are within normal limits.  A focused examination was performed including face. Relevant physical exam findings are noted in the Assessment and Plan.  face Diffuse erythematous patches with gritty scale. No evidence of specific lesion as it resolved prior to the office visit.    Assessment & Plan  AK (actinic keratosis) face  Return to clinic if recurs.  fluorouracil (EFUDEX) 5 % cream - face Apply to face nightly x 2 weeks (wash off in the morning and sun protect)    I, Lue Dubuque, PA-C, have reviewed all documentation's for this visit.  The documentation on 12/30/20 for the exam, diagnosis, procedures and orders are all accurate and complete.

## 2021-01-05 DIAGNOSIS — G4733 Obstructive sleep apnea (adult) (pediatric): Secondary | ICD-10-CM | POA: Diagnosis not present

## 2021-01-08 DIAGNOSIS — Z79890 Hormone replacement therapy (postmenopausal): Secondary | ICD-10-CM | POA: Diagnosis not present

## 2021-01-08 DIAGNOSIS — M810 Age-related osteoporosis without current pathological fracture: Secondary | ICD-10-CM | POA: Diagnosis not present

## 2021-01-08 DIAGNOSIS — R739 Hyperglycemia, unspecified: Secondary | ICD-10-CM | POA: Diagnosis not present

## 2021-01-08 DIAGNOSIS — E559 Vitamin D deficiency, unspecified: Secondary | ICD-10-CM | POA: Diagnosis not present

## 2021-01-08 DIAGNOSIS — E561 Deficiency of vitamin K: Secondary | ICD-10-CM | POA: Diagnosis not present

## 2021-01-08 DIAGNOSIS — E291 Testicular hypofunction: Secondary | ICD-10-CM | POA: Diagnosis not present

## 2021-01-15 NOTE — Addendum Note (Signed)
Addended by: Robyne Askew R on: 01/15/2021 11:36 AM   Modules accepted: Level of Service

## 2021-02-04 DIAGNOSIS — G4733 Obstructive sleep apnea (adult) (pediatric): Secondary | ICD-10-CM | POA: Diagnosis not present

## 2021-02-09 ENCOUNTER — Encounter (HOSPITAL_BASED_OUTPATIENT_CLINIC_OR_DEPARTMENT_OTHER): Payer: BC Managed Care – PPO | Admitting: Internal Medicine

## 2021-02-09 DIAGNOSIS — M79601 Pain in right arm: Secondary | ICD-10-CM | POA: Diagnosis not present

## 2021-02-09 DIAGNOSIS — M7711 Lateral epicondylitis, right elbow: Secondary | ICD-10-CM | POA: Diagnosis not present

## 2021-03-07 DIAGNOSIS — G4733 Obstructive sleep apnea (adult) (pediatric): Secondary | ICD-10-CM | POA: Diagnosis not present

## 2021-04-07 DIAGNOSIS — G4733 Obstructive sleep apnea (adult) (pediatric): Secondary | ICD-10-CM | POA: Diagnosis not present

## 2021-04-11 IMAGING — MR MRI OF THE RIGHT HIP WITH CONTRAST
4 of 5 series · 18 of 40 positions shown · IV contrast (agent unspecified)
Comparison: Plain films right hip 07/31/2018.

CLINICAL DATA: Right hip pain since seeing a chiropractor 2.5 years
ago. Question labral tear.

EXAM:
MRI OF THE RIGHT HIP WITH CONTRAST (MR Arthrogram)
TECHNIQUE: Multiplanar, multisequence MR imaging of the hip was performed
immediately following contrast injection into the hip joint under
fluoroscopic guidance. No intravenous contrast was administered.

[Series 3: T1 · coronal · 4.0mm · 0.58mm/px · 4 of 24 slices shown]
[im 1/24]
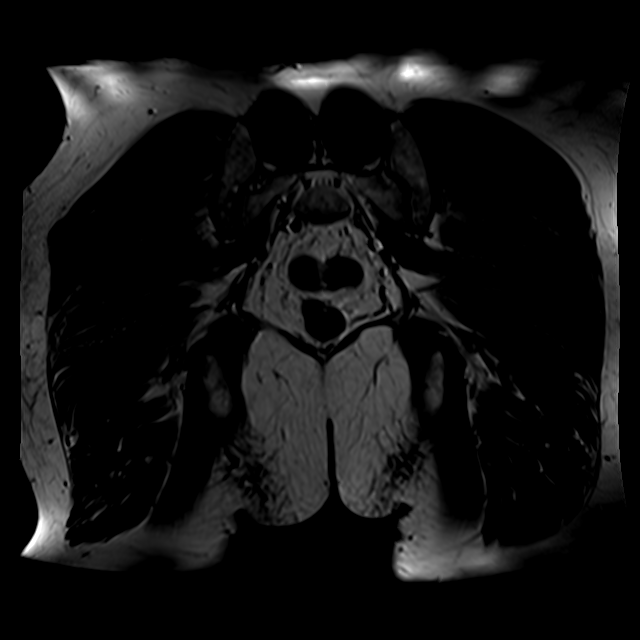
[im 4/24]
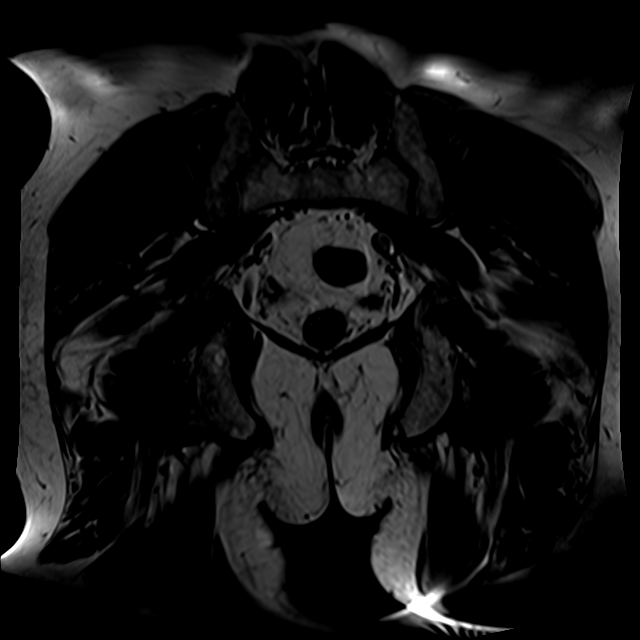
[im 12/24]
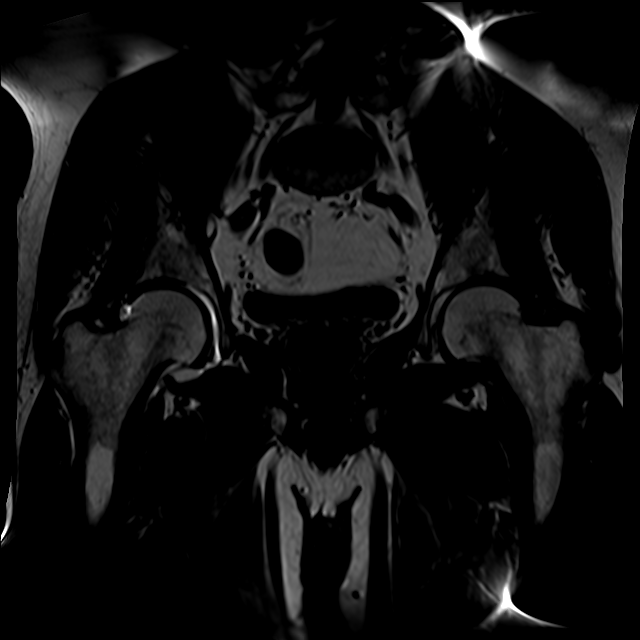
[im 20/24]
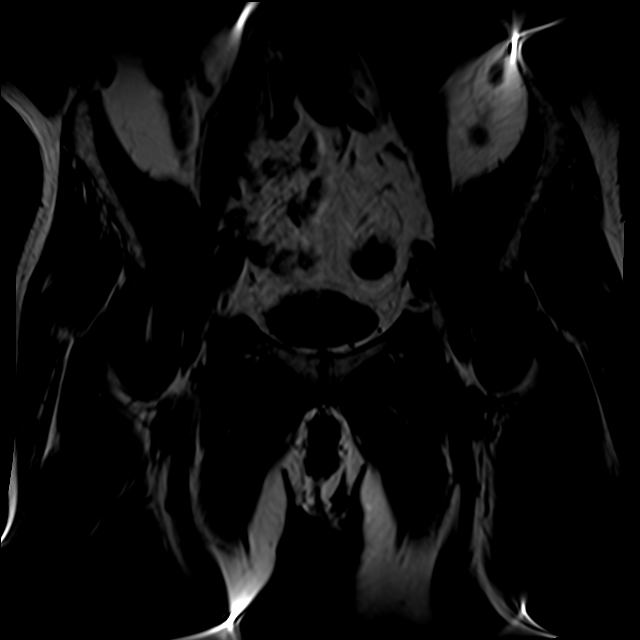

[Series 4: T2 fat-sat · coronal · 4.0mm · 0.59mm/px · 8 of 24 slices shown]
[im 1/24]
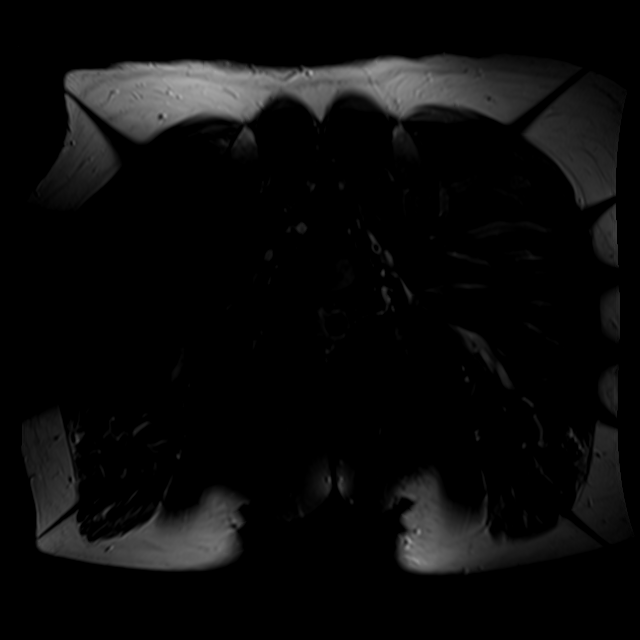
[im 4/24]
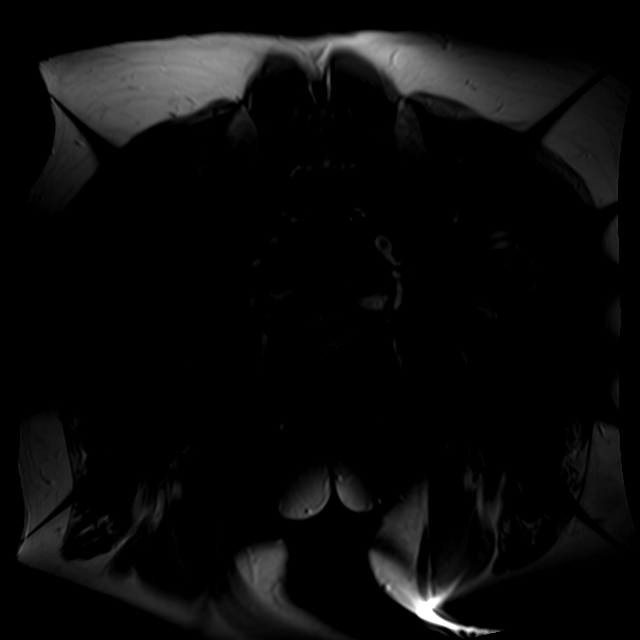
[im 7/24]
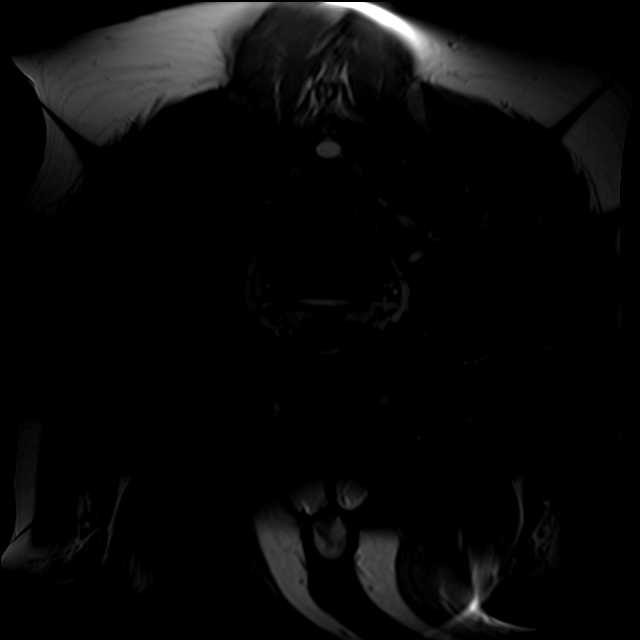
[im 10/24]
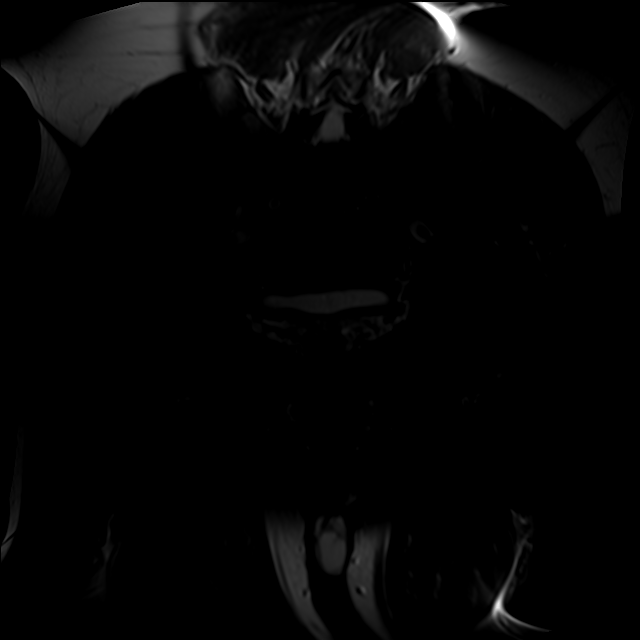
[im 14/24]
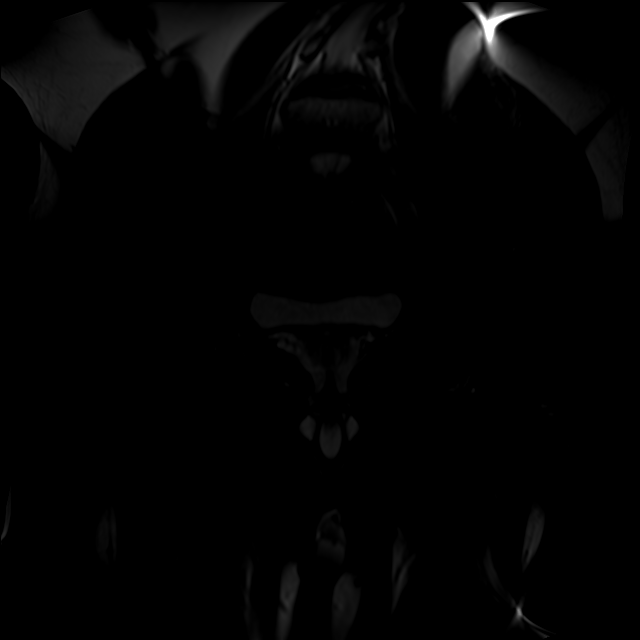
[im 17/24]
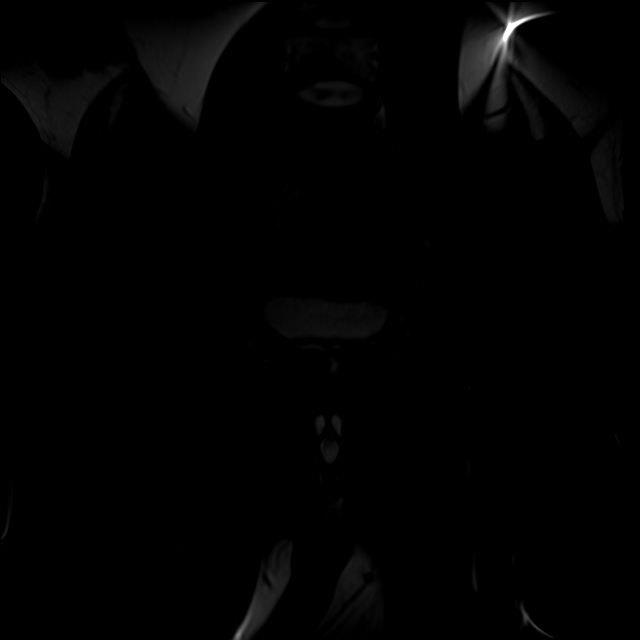
[im 20/24]
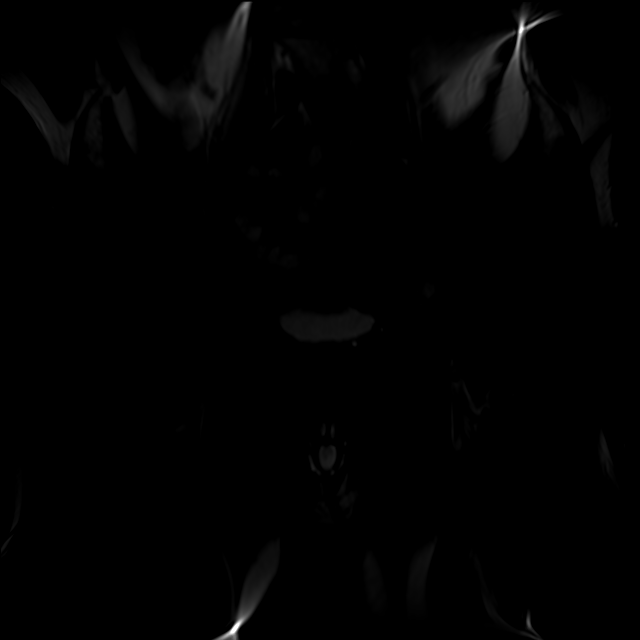
[im 24/24]
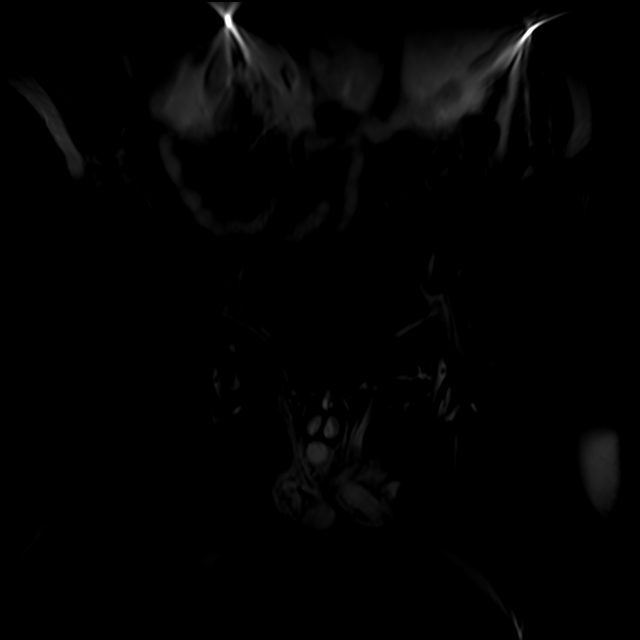

[Series 6: T1 fat-sat · axial · 4.0mm · 0.70mm/px · z∈[-134,-65]mm · 3 of 26 slices shown (1 of 2)]
[im 4/26]
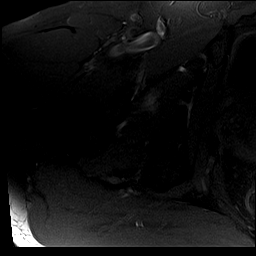
[im 13/26]
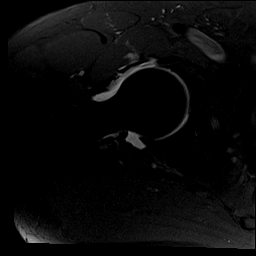
[im 22/26]
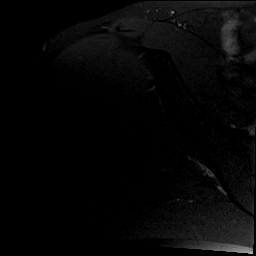

[Series 7: T1 fat-sat · sagittal · 4.0mm · 0.70mm/px · 3 of 27 slices shown (2 of 2)]
[im 4/27]
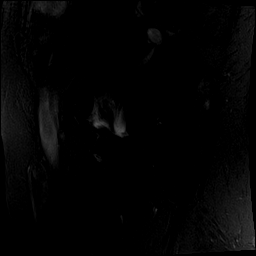
[im 14/27]
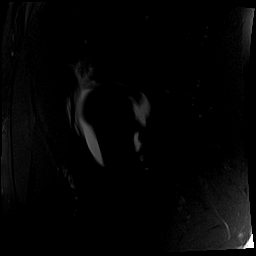
[im 23/27]
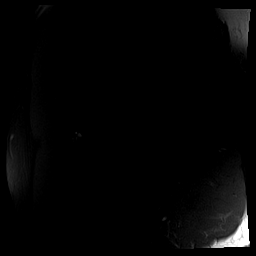

[18 of 40 positions shown; findings below may reference images not displayed]

FINDINGS: Bones: Marrow signal is normal without fracture, stress change or
worrisome lesion. No avascular necrosis of the femoral heads. No
subchondral cyst formation or edema about the hips.

Articular cartilage and labrum

Articular cartilage:  Preserved.

Labrum: Extensive tearing of the anterior, superior and superior
labrum is identified. Contrast undermines the anterior, superior
labrum. The superior labrum is markedly irregular throughout with
abnormal intrasubstance signal.

Joint or bursal effusion

Joint effusion: The right hip is distended with contrast. No left
effusion.

Bursae: Negative.

Muscles and tendons

Muscles and tendons:  Intact and normal in appearance.

Other findings

Miscellaneous:   Imaged intrapelvic contents are unremarkable.
IMPRESSION: Extensive tearing of the anterior, superior and superior right
labrum. The exam is otherwise negative.

## 2021-04-11 IMAGING — XA FLUORO GUIDED NEEDLE PLACEMENT AND/OR ASPIRATION
2 series · 2 of 2 positions shown · non-contrast
Comparison: none

CLINICAL DATA: Right hip pain.

[Series 1: ortho standard · 1 of 1 slices shown (1 of 2)]
[im 1/1]
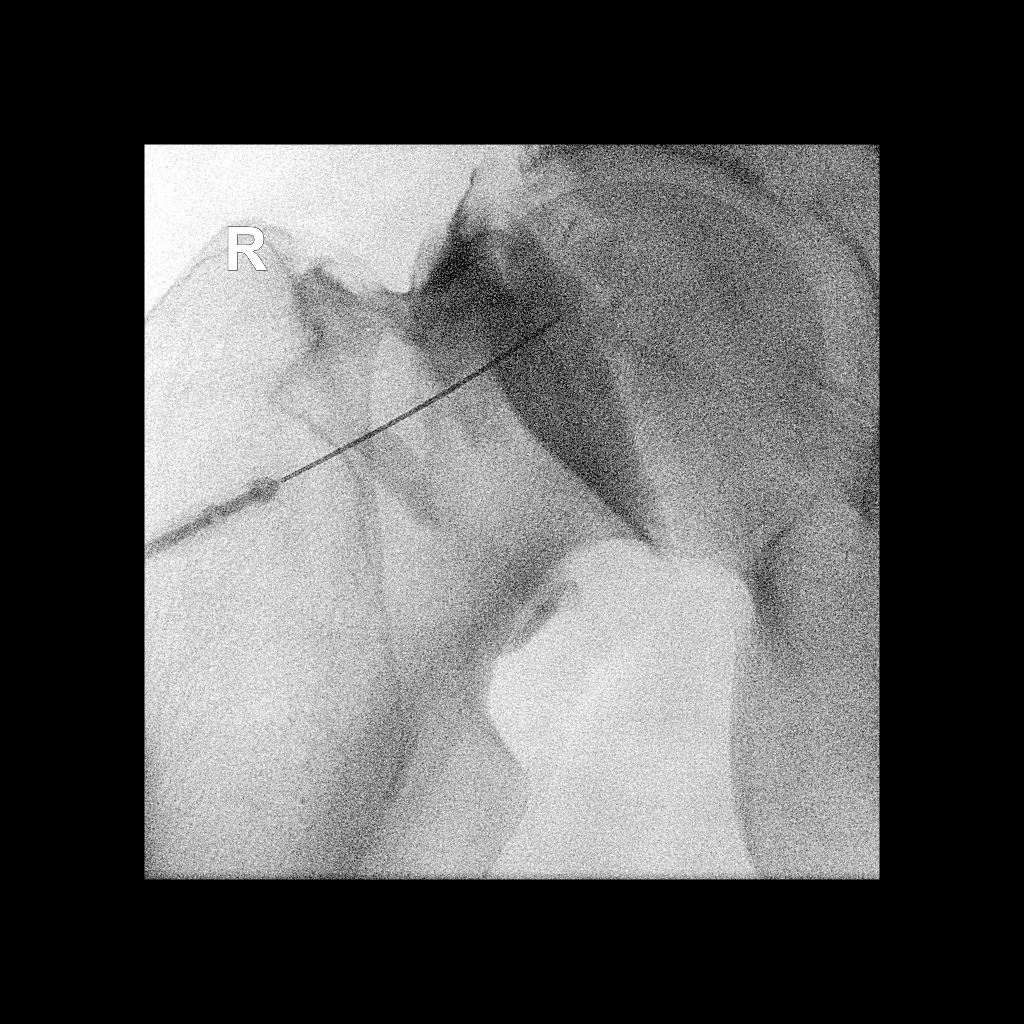

[Series 2: ortho standard · 1 of 1 slices shown (2 of 2)]
[im 1/1]
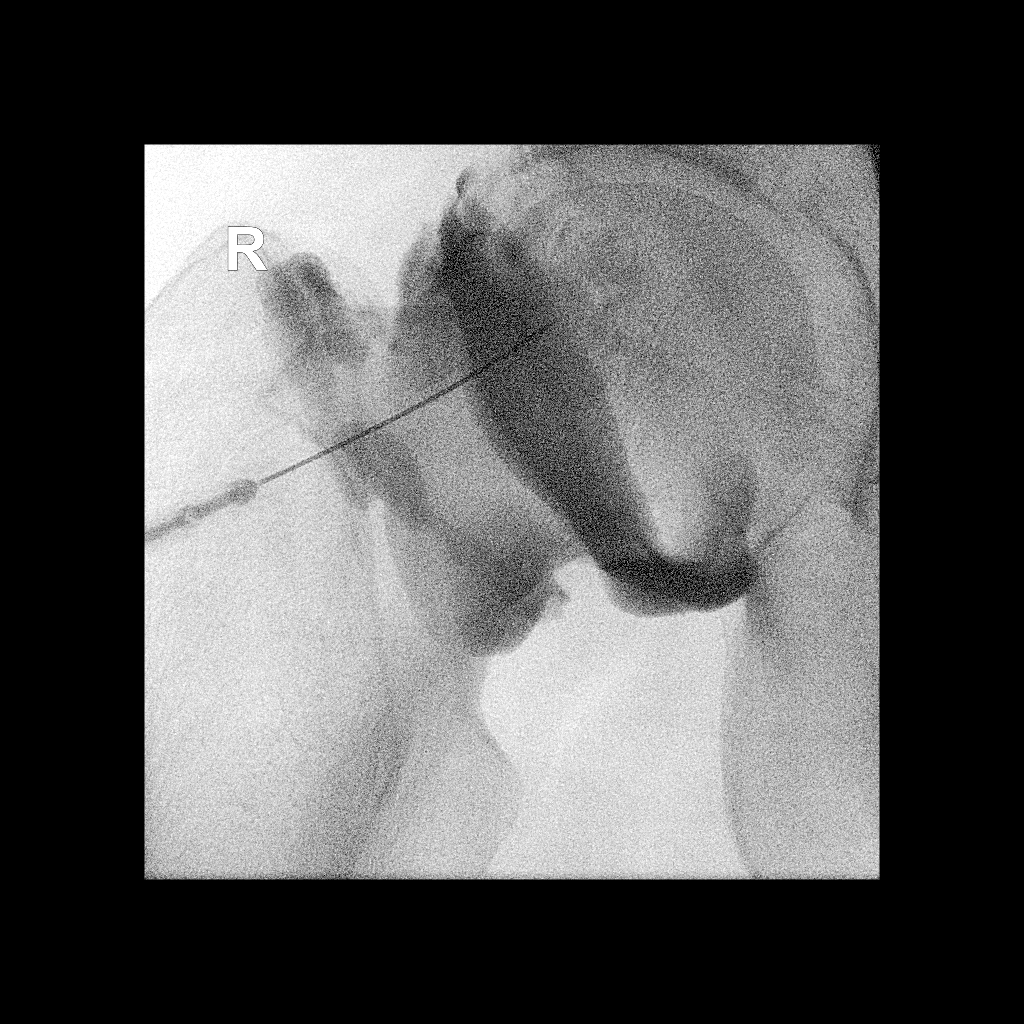

[2 of 2 positions shown; findings below may reference images not displayed]

FLUOROSCOPY TIME:  Radiation Exposure Index (as provided by the
fluoroscopic device): 9.42 uGy*m2

PROCEDURE:
Right HIP INJECTION UNDER FLUOROSCOPY

An appropriate skin entrance site was determined. The site was
marked, prepped with Betadine, draped in the usual sterile fashion,
and infiltrated locally with 1% Lidocaine. A 22 gauge spinal needle
was advanced to the superolateral margin of the femoral head under
intermittent fluoroscopy. 1 ml of Lidocaine injected easily. A
mixture of 0.1 ml Multihance in 20 ml of dilute Isovue-M 200 was
then used to opacify the right hip joint. No immediate complication.
IMPRESSION: Technically successful right hip injection for MRI.

## 2021-05-03 ENCOUNTER — Ambulatory Visit: Payer: BC Managed Care – PPO | Admitting: Internal Medicine

## 2021-05-05 DIAGNOSIS — G4733 Obstructive sleep apnea (adult) (pediatric): Secondary | ICD-10-CM | POA: Diagnosis not present

## 2021-06-05 DIAGNOSIS — G4733 Obstructive sleep apnea (adult) (pediatric): Secondary | ICD-10-CM | POA: Diagnosis not present

## 2021-06-25 DIAGNOSIS — M810 Age-related osteoporosis without current pathological fracture: Secondary | ICD-10-CM | POA: Diagnosis not present

## 2021-06-25 DIAGNOSIS — Z79890 Hormone replacement therapy (postmenopausal): Secondary | ICD-10-CM | POA: Diagnosis not present

## 2021-06-25 DIAGNOSIS — E782 Mixed hyperlipidemia: Secondary | ICD-10-CM | POA: Diagnosis not present

## 2021-06-25 DIAGNOSIS — R739 Hyperglycemia, unspecified: Secondary | ICD-10-CM | POA: Diagnosis not present

## 2021-06-25 DIAGNOSIS — E291 Testicular hypofunction: Secondary | ICD-10-CM | POA: Diagnosis not present

## 2021-07-05 DIAGNOSIS — G4733 Obstructive sleep apnea (adult) (pediatric): Secondary | ICD-10-CM | POA: Diagnosis not present

## 2021-07-16 ENCOUNTER — Telehealth: Payer: Self-pay

## 2021-07-16 NOTE — Telephone Encounter (Signed)
James Ballard from [place] called as patient advocate. He is now having pain in his Left foot, h/o PF right. Please call patient to schedule and appointment with Dr. Amalia Hailey to evaluate new left foot pain ?Thanks ?

## 2021-07-19 ENCOUNTER — Ambulatory Visit (INDEPENDENT_AMBULATORY_CARE_PROVIDER_SITE_OTHER): Payer: BC Managed Care – PPO

## 2021-07-19 ENCOUNTER — Ambulatory Visit (INDEPENDENT_AMBULATORY_CARE_PROVIDER_SITE_OTHER): Payer: BC Managed Care – PPO | Admitting: Podiatry

## 2021-07-19 DIAGNOSIS — M722 Plantar fascial fibromatosis: Secondary | ICD-10-CM | POA: Diagnosis not present

## 2021-07-19 NOTE — Progress Notes (Signed)
? ?  Subjective: ?56 y.o. male presenting today for evaluation of left foot arch pain that has been going on for about 2 months now.  He denies a history of injury.  He says that he has been stretching and applying ice daily.  He is old orthotics that he has are beginning to be old and he is requesting a new pair of orthotics today. ? ? ?Past Medical History:  ?Diagnosis Date  ? Hyperlipidemia   ? ?Past Surgical History:  ?Procedure Laterality Date  ? HEMORROIDECTOMY    ? KNEE ARTHROSCOPY    ? left  ? sesmoid fx- excised    ? ULNAR NERVE TRANSPOSITION    ? ?Allergies  ?Allergen Reactions  ? Doxycycline Hyclate   ?  REACTION: rash  ? Minocycline Dermatitis  ? ? ? ?Objective: ?Physical Exam ?General: The patient is alert and oriented x3 in no acute distress. ? ?Dermatology: Skin is warm, dry and supple bilateral lower extremities. Negative for open lesions or macerations bilateral.  ? ?Vascular: Dorsalis Pedis and Posterior Tibial pulses palpable bilateral.  Capillary fill time is immediate to all digits. ? ?Neurological: Epicritic and protective threshold intact bilateral.  ? ?Musculoskeletal: Tenderness to palpation to the plantar aspect of the left heel along the plantar fascia. All other joints range of motion within normal limits bilateral. Strength 5/5 in all groups bilateral.  ? ?Radiographic exam: Normal osseous mineralization. Joint spaces preserved. No fracture/dislocation/boney destruction. No other soft tissue abnormalities or radiopaque foreign bodies.  ? ?Assessment: ?1.  Acute inflammatory plantar fasciitis left foot x2 months ?2.  PMHx chronic plantar fasciitis RT foot times 3+ years ? ?Plan of Care:  ?1. Patient evaluated. Xrays reviewed.   ?2.  Appointment with Pedorthist for new custom molded orthotics ?3.  Recommend good supportive shoes and sneakers ?4.  Continue conservative management including daily stretching exercises, ice, oral anti-inflammatory as needed ?5.  Return to clinic as  needed ? 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  Skateboards.  Plays tennis. ? ?Edrick Kins, DPM ?[facility] ? ?Dr. Edrick Kins, DPM  ?  ?[address]                                     ?[place], [place] [address]                ?Office [phone]  ?Fax [phone] ? ? ? ? ?

## 2021-07-20 ENCOUNTER — Ambulatory Visit (INDEPENDENT_AMBULATORY_CARE_PROVIDER_SITE_OTHER): Payer: BC Managed Care – PPO

## 2021-07-20 DIAGNOSIS — M722 Plantar fascial fibromatosis: Secondary | ICD-10-CM

## 2021-07-20 NOTE — Progress Notes (Signed)
SITUATION ?Reason for Consult: Evaluation for Bilateral Custom Foot Orthoses ?Patient / Caregiver Report: Patient is ready for foot orthotics ? ?OBJECTIVE DATA: ?Patient History / Diagnosis:  ?  ICD-10-CM   ?1. Plantar fasciitis of left foot  M72.2   ?  ?2. Plantar fasciitis of right foot  M72.2   ?  ? ? ?Current or Previous Devices:   Current user ? ?Foot Examination: ?Skin presentation:   Intact ?Ulcers & Callousing:   None ?Toe / Foot Deformities:  Sesamoid removal left ?Weight Bearing Presentation:  Rectus ?Sensation:    Intact ? ?Shoe Size:    38M ? ?ORTHOTIC RECOMMENDATION ?Recommended Device: 1x pair of custom functional foot orthotics ? ?GOALS OF ORTHOSES ?- Reduce Pain ?- Prevent Foot Deformity ?- Prevent Progression of Further Foot Deformity ?- Relieve Pressure ?- Improve the Overall Biomechanical Function of the Foot and Lower Extremity. ? ?ACTIONS PERFORMED ?Potential out of pocket cost was communicated to patient. Patient understood and consent to casting. Patient was casted for Foot Orthoses via crush box. Procedure was explained and patient tolerated procedure well. Casts were shipped to central fabrication. All questions were answered and concerns addressed. ? ?PLAN ?Patient is to be called for fitting when devices are ready.  ? ? ?

## 2021-08-05 DIAGNOSIS — M25572 Pain in left ankle and joints of left foot: Secondary | ICD-10-CM | POA: Diagnosis not present

## 2021-08-05 DIAGNOSIS — M25571 Pain in right ankle and joints of right foot: Secondary | ICD-10-CM | POA: Diagnosis not present

## 2021-08-05 DIAGNOSIS — M25671 Stiffness of right ankle, not elsewhere classified: Secondary | ICD-10-CM | POA: Diagnosis not present

## 2021-08-05 DIAGNOSIS — M25672 Stiffness of left ankle, not elsewhere classified: Secondary | ICD-10-CM | POA: Diagnosis not present

## 2021-08-12 ENCOUNTER — Ambulatory Visit (INDEPENDENT_AMBULATORY_CARE_PROVIDER_SITE_OTHER): Payer: BC Managed Care – PPO

## 2021-08-12 DIAGNOSIS — M722 Plantar fascial fibromatosis: Secondary | ICD-10-CM

## 2021-08-12 NOTE — Progress Notes (Signed)
SITUATION: Reason for Visit: Fitting and Delivery of Custom Fabricated Foot Orthoses Patient Report: Patient reports comfort and is satisfied with device.  OBJECTIVE DATA: Patient History / Diagnosis:     ICD-10-CM   1. Plantar fasciitis of left foot  M72.2     2. Plantar fasciitis of right foot  M72.2       Provided Device:  Custom Functional Foot Orthotics     [place]: [id number]  GOAL OF ORTHOSIS - Improve gait - Decrease energy expenditure - Improve Balance - Provide Triplanar stability of foot complex - Facilitate motion  ACTIONS PERFORMED Patient was fit with foot orthotics trimmed to shoe last. Patient tolerated fittign procedure.   Patient was provided with verbal and written instruction and demonstration regarding donning, doffing, wear, care, proper fit, function, purpose, cleaning, and use of the orthosis and in all related precautions and risks and benefits regarding the orthosis.  Patient was also provided with verbal instruction regarding how to report any failures or malfunctions of the orthosis and necessary follow up care. Patient was also instructed to contact our office regarding any change in status that may affect the function of the orthosis.  Patient demonstrated independence with proper donning, doffing, and fit and verbalized understanding of all instructions.  PLAN: Patient is to follow up in one week or as necessary (PRN). All questions were answered and concerns addressed. Plan of care was discussed with and agreed upon by the patient.

## 2021-08-13 DIAGNOSIS — M25671 Stiffness of right ankle, not elsewhere classified: Secondary | ICD-10-CM | POA: Diagnosis not present

## 2021-08-13 DIAGNOSIS — M25571 Pain in right ankle and joints of right foot: Secondary | ICD-10-CM | POA: Diagnosis not present

## 2021-08-13 DIAGNOSIS — M25672 Stiffness of left ankle, not elsewhere classified: Secondary | ICD-10-CM | POA: Diagnosis not present

## 2021-08-13 DIAGNOSIS — M25572 Pain in left ankle and joints of left foot: Secondary | ICD-10-CM | POA: Diagnosis not present

## 2021-08-16 DIAGNOSIS — M25572 Pain in left ankle and joints of left foot: Secondary | ICD-10-CM | POA: Diagnosis not present

## 2021-08-16 DIAGNOSIS — M25571 Pain in right ankle and joints of right foot: Secondary | ICD-10-CM | POA: Diagnosis not present

## 2021-08-16 DIAGNOSIS — M25671 Stiffness of right ankle, not elsewhere classified: Secondary | ICD-10-CM | POA: Diagnosis not present

## 2021-08-16 DIAGNOSIS — M25672 Stiffness of left ankle, not elsewhere classified: Secondary | ICD-10-CM | POA: Diagnosis not present

## 2021-08-19 DIAGNOSIS — M25572 Pain in left ankle and joints of left foot: Secondary | ICD-10-CM | POA: Diagnosis not present

## 2021-08-19 DIAGNOSIS — M25672 Stiffness of left ankle, not elsewhere classified: Secondary | ICD-10-CM | POA: Diagnosis not present

## 2021-08-19 DIAGNOSIS — M25571 Pain in right ankle and joints of right foot: Secondary | ICD-10-CM | POA: Diagnosis not present

## 2021-08-19 DIAGNOSIS — M25671 Stiffness of right ankle, not elsewhere classified: Secondary | ICD-10-CM | POA: Diagnosis not present

## 2021-08-24 DIAGNOSIS — M25672 Stiffness of left ankle, not elsewhere classified: Secondary | ICD-10-CM | POA: Diagnosis not present

## 2021-08-24 DIAGNOSIS — M25572 Pain in left ankle and joints of left foot: Secondary | ICD-10-CM | POA: Diagnosis not present

## 2021-08-24 DIAGNOSIS — M25671 Stiffness of right ankle, not elsewhere classified: Secondary | ICD-10-CM | POA: Diagnosis not present

## 2021-08-24 DIAGNOSIS — M25571 Pain in right ankle and joints of right foot: Secondary | ICD-10-CM | POA: Diagnosis not present

## 2021-08-26 DIAGNOSIS — G4733 Obstructive sleep apnea (adult) (pediatric): Secondary | ICD-10-CM | POA: Diagnosis not present

## 2021-08-27 DIAGNOSIS — M25571 Pain in right ankle and joints of right foot: Secondary | ICD-10-CM | POA: Diagnosis not present

## 2021-08-27 DIAGNOSIS — M25671 Stiffness of right ankle, not elsewhere classified: Secondary | ICD-10-CM | POA: Diagnosis not present

## 2021-08-27 DIAGNOSIS — M25572 Pain in left ankle and joints of left foot: Secondary | ICD-10-CM | POA: Diagnosis not present

## 2021-08-27 DIAGNOSIS — M25672 Stiffness of left ankle, not elsewhere classified: Secondary | ICD-10-CM | POA: Diagnosis not present

## 2021-08-31 ENCOUNTER — Other Ambulatory Visit: Payer: BC Managed Care – PPO

## 2021-08-31 DIAGNOSIS — M25572 Pain in left ankle and joints of left foot: Secondary | ICD-10-CM | POA: Diagnosis not present

## 2021-08-31 DIAGNOSIS — M25672 Stiffness of left ankle, not elsewhere classified: Secondary | ICD-10-CM | POA: Diagnosis not present

## 2021-08-31 DIAGNOSIS — M25571 Pain in right ankle and joints of right foot: Secondary | ICD-10-CM | POA: Diagnosis not present

## 2021-08-31 DIAGNOSIS — M25671 Stiffness of right ankle, not elsewhere classified: Secondary | ICD-10-CM | POA: Diagnosis not present

## 2021-09-15 DIAGNOSIS — M25671 Stiffness of right ankle, not elsewhere classified: Secondary | ICD-10-CM | POA: Diagnosis not present

## 2021-09-15 DIAGNOSIS — M25672 Stiffness of left ankle, not elsewhere classified: Secondary | ICD-10-CM | POA: Diagnosis not present

## 2021-09-15 DIAGNOSIS — M25572 Pain in left ankle and joints of left foot: Secondary | ICD-10-CM | POA: Diagnosis not present

## 2021-09-15 DIAGNOSIS — M25571 Pain in right ankle and joints of right foot: Secondary | ICD-10-CM | POA: Diagnosis not present

## 2021-09-17 DIAGNOSIS — M25672 Stiffness of left ankle, not elsewhere classified: Secondary | ICD-10-CM | POA: Diagnosis not present

## 2021-09-17 DIAGNOSIS — M25571 Pain in right ankle and joints of right foot: Secondary | ICD-10-CM | POA: Diagnosis not present

## 2021-09-17 DIAGNOSIS — M25572 Pain in left ankle and joints of left foot: Secondary | ICD-10-CM | POA: Diagnosis not present

## 2021-09-17 DIAGNOSIS — M25671 Stiffness of right ankle, not elsewhere classified: Secondary | ICD-10-CM | POA: Diagnosis not present

## 2021-09-25 DIAGNOSIS — G4733 Obstructive sleep apnea (adult) (pediatric): Secondary | ICD-10-CM | POA: Diagnosis not present

## 2021-09-27 DIAGNOSIS — M25571 Pain in right ankle and joints of right foot: Secondary | ICD-10-CM | POA: Diagnosis not present

## 2021-09-27 DIAGNOSIS — M25672 Stiffness of left ankle, not elsewhere classified: Secondary | ICD-10-CM | POA: Diagnosis not present

## 2021-09-27 DIAGNOSIS — M25572 Pain in left ankle and joints of left foot: Secondary | ICD-10-CM | POA: Diagnosis not present

## 2021-09-27 DIAGNOSIS — M25671 Stiffness of right ankle, not elsewhere classified: Secondary | ICD-10-CM | POA: Diagnosis not present

## 2021-09-29 DIAGNOSIS — M25571 Pain in right ankle and joints of right foot: Secondary | ICD-10-CM | POA: Diagnosis not present

## 2021-09-29 DIAGNOSIS — M25572 Pain in left ankle and joints of left foot: Secondary | ICD-10-CM | POA: Diagnosis not present

## 2021-09-29 DIAGNOSIS — M25671 Stiffness of right ankle, not elsewhere classified: Secondary | ICD-10-CM | POA: Diagnosis not present

## 2021-09-29 DIAGNOSIS — M25672 Stiffness of left ankle, not elsewhere classified: Secondary | ICD-10-CM | POA: Diagnosis not present

## 2021-10-12 DIAGNOSIS — Z13220 Encounter for screening for lipoid disorders: Secondary | ICD-10-CM | POA: Diagnosis not present

## 2021-10-12 DIAGNOSIS — E561 Deficiency of vitamin K: Secondary | ICD-10-CM | POA: Diagnosis not present

## 2021-10-12 DIAGNOSIS — E559 Vitamin D deficiency, unspecified: Secondary | ICD-10-CM | POA: Diagnosis not present

## 2021-10-12 DIAGNOSIS — R739 Hyperglycemia, unspecified: Secondary | ICD-10-CM | POA: Diagnosis not present

## 2021-10-19 DIAGNOSIS — M7711 Lateral epicondylitis, right elbow: Secondary | ICD-10-CM | POA: Diagnosis not present

## 2021-10-26 DIAGNOSIS — G4733 Obstructive sleep apnea (adult) (pediatric): Secondary | ICD-10-CM | POA: Diagnosis not present

## 2021-11-16 DIAGNOSIS — E291 Testicular hypofunction: Secondary | ICD-10-CM | POA: Diagnosis not present

## 2021-11-16 DIAGNOSIS — E559 Vitamin D deficiency, unspecified: Secondary | ICD-10-CM | POA: Diagnosis not present

## 2021-11-16 DIAGNOSIS — Z79890 Hormone replacement therapy (postmenopausal): Secondary | ICD-10-CM | POA: Diagnosis not present

## 2021-11-16 DIAGNOSIS — E8881 Metabolic syndrome: Secondary | ICD-10-CM | POA: Diagnosis not present

## 2021-11-16 DIAGNOSIS — M810 Age-related osteoporosis without current pathological fracture: Secondary | ICD-10-CM | POA: Diagnosis not present

## 2021-12-27 DIAGNOSIS — M7711 Lateral epicondylitis, right elbow: Secondary | ICD-10-CM | POA: Diagnosis not present

## 2022-02-07 ENCOUNTER — Ambulatory Visit (INDEPENDENT_AMBULATORY_CARE_PROVIDER_SITE_OTHER): Payer: BC Managed Care – PPO | Admitting: Podiatry

## 2022-02-07 ENCOUNTER — Ambulatory Visit (INDEPENDENT_AMBULATORY_CARE_PROVIDER_SITE_OTHER): Payer: BC Managed Care – PPO

## 2022-02-07 DIAGNOSIS — M722 Plantar fascial fibromatosis: Secondary | ICD-10-CM

## 2022-02-07 MED ORDER — BETAMETHASONE SOD PHOS & ACET 6 (3-3) MG/ML IJ SUSP
3.0000 mg | Freq: Once | INTRAMUSCULAR | Status: AC
  Administered 2022-02-07: 3 mg via INTRA_ARTICULAR

## 2022-02-07 NOTE — Progress Notes (Signed)
   Chief Complaint  Patient presents with   Foot Pain     Bilateral heel pain. Patient has a hx of plantar fascitis. States heel pain is constant. Patient has gotten orthotics from [place] that are not helpful     Subjective: 56 y.o. male presenting today for follow-up evaluation of left foot arch pain that has been going on for several months now.  He is very frustrated because it is affecting his daily activity.  He has a history of chronic bilateral plantar fasciitis.  He says that the custom molded orthotics did not help alleviate any of the symptoms.  Presenting for follow-up treatment evaluation   Past Medical History:  Diagnosis Date   Hyperlipidemia    Past Surgical History:  Procedure Laterality Date   HEMORROIDECTOMY     KNEE ARTHROSCOPY     left   sesmoid fx- excised     ULNAR NERVE TRANSPOSITION     Allergies  Allergen Reactions   Doxycycline Hyclate     REACTION: rash   Minocycline Dermatitis     Objective: Physical Exam General: The patient is alert and oriented x3 in no acute distress.  Dermatology: Skin is warm, dry and supple bilateral lower extremities. Negative for open lesions or macerations bilateral.   Vascular: Dorsalis Pedis and Posterior Tibial pulses palpable bilateral.  Capillary fill time is immediate to all digits.  Neurological: Epicritic and protective threshold intact bilateral.   Musculoskeletal: There continues to be moderate to severe tenderness to palpation to the plantar aspect of the left heel along the plantar fascia. All other joints range of motion within normal limits bilateral. Strength 5/5 in all groups bilateral.   Radiographic exam LT foot 02/07/2022: Normal osseous mineralization. Joint spaces preserved. No fracture/dislocation/boney destruction. No other soft tissue abnormalities or radiopaque foreign bodies.   Assessment: 1.  Acute on chronic inflammatory plantar fasciitis left foot  2.  PMHx chronic plantar fasciitis RT foot  times 3+ years  Plan of Care:  1. Patient evaluated. Xrays reviewed.   2.  Patient did not have any relief with the custom molded orthotics.  Continue OTC Protalus arch supports that he purchased 3.  Injection of 0.5 cc Celestone Soluspan injected in the plantar fascia left 4.  Again today we discussed surgery in detail including endoscopic plantar fasciotomy.  Details of procedure were explained and I do believe that is warranted at this time since the patient has had chronic plantar fasciitis ongoing now for over 1 year now to the left foot as well as several years to the right.  Risk benefits advantages and disadvantages were explained.  No guarantees were expressed or implied. 5.  The patient is going to have tendon repair/right elbow surgery with Dr. Tamera Punt next week.  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  Skateboards.  Plays tennis.  Edrick Kins, DPM [facility]  Dr. Edrick Kins, DPM    [address], [place] [address]                Office [phone]  Fax [phone]

## 2022-02-08 DIAGNOSIS — G4733 Obstructive sleep apnea (adult) (pediatric): Secondary | ICD-10-CM | POA: Diagnosis not present

## 2022-02-15 DIAGNOSIS — M7711 Lateral epicondylitis, right elbow: Secondary | ICD-10-CM | POA: Diagnosis not present

## 2022-02-25 DIAGNOSIS — M7711 Lateral epicondylitis, right elbow: Secondary | ICD-10-CM | POA: Diagnosis not present

## 2022-03-10 DIAGNOSIS — Z13220 Encounter for screening for lipoid disorders: Secondary | ICD-10-CM | POA: Diagnosis not present

## 2022-03-10 DIAGNOSIS — R739 Hyperglycemia, unspecified: Secondary | ICD-10-CM | POA: Diagnosis not present

## 2022-03-10 DIAGNOSIS — E559 Vitamin D deficiency, unspecified: Secondary | ICD-10-CM | POA: Diagnosis not present

## 2022-03-11 DIAGNOSIS — G4733 Obstructive sleep apnea (adult) (pediatric): Secondary | ICD-10-CM | POA: Diagnosis not present

## 2022-03-30 DIAGNOSIS — M7711 Lateral epicondylitis, right elbow: Secondary | ICD-10-CM | POA: Diagnosis not present

## 2022-03-30 DIAGNOSIS — M25621 Stiffness of right elbow, not elsewhere classified: Secondary | ICD-10-CM | POA: Diagnosis not present

## 2022-04-11 DIAGNOSIS — G4733 Obstructive sleep apnea (adult) (pediatric): Secondary | ICD-10-CM | POA: Diagnosis not present

## 2022-04-18 DIAGNOSIS — M25621 Stiffness of right elbow, not elsewhere classified: Secondary | ICD-10-CM | POA: Diagnosis not present

## 2022-04-18 DIAGNOSIS — M7711 Lateral epicondylitis, right elbow: Secondary | ICD-10-CM | POA: Diagnosis not present

## 2022-04-20 DIAGNOSIS — M7711 Lateral epicondylitis, right elbow: Secondary | ICD-10-CM | POA: Diagnosis not present

## 2022-04-20 DIAGNOSIS — M25621 Stiffness of right elbow, not elsewhere classified: Secondary | ICD-10-CM | POA: Diagnosis not present

## 2022-04-28 DIAGNOSIS — M25621 Stiffness of right elbow, not elsewhere classified: Secondary | ICD-10-CM | POA: Diagnosis not present

## 2022-04-28 DIAGNOSIS — M7711 Lateral epicondylitis, right elbow: Secondary | ICD-10-CM | POA: Diagnosis not present

## 2022-05-03 DIAGNOSIS — M25621 Stiffness of right elbow, not elsewhere classified: Secondary | ICD-10-CM | POA: Diagnosis not present

## 2022-05-03 DIAGNOSIS — M7711 Lateral epicondylitis, right elbow: Secondary | ICD-10-CM | POA: Diagnosis not present

## 2022-05-05 DIAGNOSIS — M25621 Stiffness of right elbow, not elsewhere classified: Secondary | ICD-10-CM | POA: Diagnosis not present

## 2022-05-05 DIAGNOSIS — M7711 Lateral epicondylitis, right elbow: Secondary | ICD-10-CM | POA: Diagnosis not present

## 2022-05-10 DIAGNOSIS — M25621 Stiffness of right elbow, not elsewhere classified: Secondary | ICD-10-CM | POA: Diagnosis not present

## 2022-05-10 DIAGNOSIS — M7711 Lateral epicondylitis, right elbow: Secondary | ICD-10-CM | POA: Diagnosis not present

## 2022-05-12 DIAGNOSIS — M25621 Stiffness of right elbow, not elsewhere classified: Secondary | ICD-10-CM | POA: Diagnosis not present

## 2022-05-12 DIAGNOSIS — M7711 Lateral epicondylitis, right elbow: Secondary | ICD-10-CM | POA: Diagnosis not present

## 2022-05-16 DIAGNOSIS — M7711 Lateral epicondylitis, right elbow: Secondary | ICD-10-CM | POA: Diagnosis not present

## 2022-05-16 DIAGNOSIS — M25621 Stiffness of right elbow, not elsewhere classified: Secondary | ICD-10-CM | POA: Diagnosis not present

## 2022-05-18 DIAGNOSIS — M7711 Lateral epicondylitis, right elbow: Secondary | ICD-10-CM | POA: Diagnosis not present

## 2022-05-18 DIAGNOSIS — M25621 Stiffness of right elbow, not elsewhere classified: Secondary | ICD-10-CM | POA: Diagnosis not present

## 2022-05-20 DIAGNOSIS — E785 Hyperlipidemia, unspecified: Secondary | ICD-10-CM | POA: Diagnosis not present

## 2022-05-20 DIAGNOSIS — E8881 Metabolic syndrome: Secondary | ICD-10-CM | POA: Diagnosis not present

## 2022-05-20 DIAGNOSIS — E291 Testicular hypofunction: Secondary | ICD-10-CM | POA: Diagnosis not present

## 2022-05-20 DIAGNOSIS — Z79890 Hormone replacement therapy (postmenopausal): Secondary | ICD-10-CM | POA: Diagnosis not present

## 2022-05-23 DIAGNOSIS — M7711 Lateral epicondylitis, right elbow: Secondary | ICD-10-CM | POA: Diagnosis not present

## 2022-05-23 DIAGNOSIS — M25621 Stiffness of right elbow, not elsewhere classified: Secondary | ICD-10-CM | POA: Diagnosis not present

## 2022-05-25 DIAGNOSIS — M7711 Lateral epicondylitis, right elbow: Secondary | ICD-10-CM | POA: Diagnosis not present

## 2022-05-25 DIAGNOSIS — M25621 Stiffness of right elbow, not elsewhere classified: Secondary | ICD-10-CM | POA: Diagnosis not present

## 2022-05-27 ENCOUNTER — Ambulatory Visit (INDEPENDENT_AMBULATORY_CARE_PROVIDER_SITE_OTHER): Payer: BC Managed Care – PPO | Admitting: Podiatry

## 2022-05-27 DIAGNOSIS — M722 Plantar fascial fibromatosis: Secondary | ICD-10-CM | POA: Diagnosis not present

## 2022-05-27 MED ORDER — METHYLPREDNISOLONE 4 MG PO TBPK: ORAL_TABLET | ORAL | 0 refills | Status: DC

## 2022-05-27 MED ORDER — BETAMETHASONE SOD PHOS & ACET 6 (3-3) MG/ML IJ SUSP
3.0000 mg | Freq: Once | INTRAMUSCULAR | Status: AC
  Administered 2022-05-27: 3 mg via INTRA_ARTICULAR

## 2022-05-27 MED ORDER — MELOXICAM 15 MG PO TABS: 15.0000 mg | ORAL_TABLET | Freq: Every day | ORAL | 1 refills | Status: DC

## 2022-05-27 NOTE — Progress Notes (Signed)
   Chief Complaint  Patient presents with   Plantar Fasciitis    Patient came in today for plantar fasciitis follow-up, patient would like an injection today, he is going on a trip in a few days. Patient would also like to talk about the EPAT and PRP injections, Patient needs a refill on mobic     Subjective: 57 y.o. male presenting today for follow-up evaluation of left foot plantar fasciitis.  Patient is concerned because he is leaving for [facility] tomorrow and is requesting cortisone injection.  He is currently recovering from right elbow surgery and doing well.  He is contemplating having plantar fascial surgery later this winter in approximately 1 year.  He presents for further treatment evaluation   Past Medical History:  Diagnosis Date   Hyperlipidemia    Past Surgical History:  Procedure Laterality Date   HEMORROIDECTOMY     KNEE ARTHROSCOPY     left   sesmoid fx- excised     ULNAR NERVE TRANSPOSITION     Allergies  Allergen Reactions   Doxycycline Hyclate     REACTION: rash   Minocycline Dermatitis     Objective: Physical Exam General: The patient is alert and oriented x3 in no acute distress.  Dermatology: Skin is warm, dry and supple bilateral lower extremities. Negative for open lesions or macerations bilateral.   Vascular: Dorsalis Pedis and Posterior Tibial pulses palpable bilateral.  Capillary fill time is immediate to all digits.  Neurological: Epicritic and protective threshold intact bilateral.   Musculoskeletal: There continues to be moderate to severe tenderness to palpation to the plantar aspect of the left heel along the plantar fascia. All other joints range of motion within normal limits bilateral. Strength 5/5 in all groups bilateral.   Radiographic exam LT foot 02/07/2022: Normal osseous mineralization. Joint spaces preserved. No fracture/dislocation/boney destruction. No other soft tissue abnormalities or radiopaque foreign bodies.   Assessment: 1.   Acute on chronic inflammatory plantar fasciitis left foot  2.  PMHx chronic plantar fasciitis RT foot times 3+ years  Plan of Care:  -Patient evaluated. Xrays reviewed.   -Patient did not have any relief with the custom molded orthotics.  Continue OTC Protalus arch supports that he purchased -Injection of 0.5 cc Celestone Soluspan injected in the plantar fascia left -Prescription for Medrol Dosepak and meloxicam 15 mg daily after completion of the Dosepak -Again today we discussed surgery in detail including endoscopic plantar fasciotomy.  Details of procedure were explained and I do believe that is warranted at this time since the patient has had chronic plantar fasciitis ongoing now for over 1 year now to the left foot as well as several years to the right.  Risk benefits advantages and disadvantages were explained.  No guarantees were expressed or implied. -Patient planning for endoscopic plantar fasciotomy surgery to the left foot this winter. 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  Skateboards.  Plays tennis. *Leaving for [place] for vacation tomorrow, 05/28/2022  Edrick Kins, DPM [facility]  Dr. Edrick Kins, DPM    [address], [place] [address]                Office [phone]  Fax [phone]

## 2022-06-15 DIAGNOSIS — M25521 Pain in right elbow: Secondary | ICD-10-CM | POA: Diagnosis not present

## 2022-07-11 DIAGNOSIS — L578 Other skin changes due to chronic exposure to nonionizing radiation: Secondary | ICD-10-CM | POA: Diagnosis not present

## 2022-07-11 DIAGNOSIS — L820 Inflamed seborrheic keratosis: Secondary | ICD-10-CM | POA: Diagnosis not present

## 2022-07-11 DIAGNOSIS — L814 Other melanin hyperpigmentation: Secondary | ICD-10-CM | POA: Diagnosis not present

## 2022-07-11 DIAGNOSIS — L570 Actinic keratosis: Secondary | ICD-10-CM | POA: Diagnosis not present

## 2022-07-11 DIAGNOSIS — D485 Neoplasm of uncertain behavior of skin: Secondary | ICD-10-CM | POA: Diagnosis not present

## 2022-07-11 DIAGNOSIS — L821 Other seborrheic keratosis: Secondary | ICD-10-CM | POA: Diagnosis not present

## 2022-07-11 DIAGNOSIS — D1801 Hemangioma of skin and subcutaneous tissue: Secondary | ICD-10-CM | POA: Diagnosis not present

## 2022-07-26 ENCOUNTER — Other Ambulatory Visit: Payer: Self-pay | Admitting: Podiatry

## 2022-08-10 DIAGNOSIS — M25521 Pain in right elbow: Secondary | ICD-10-CM | POA: Diagnosis not present

## 2022-08-22 DIAGNOSIS — G4733 Obstructive sleep apnea (adult) (pediatric): Secondary | ICD-10-CM | POA: Diagnosis not present

## 2022-08-23 DIAGNOSIS — M79642 Pain in left hand: Secondary | ICD-10-CM | POA: Diagnosis not present

## 2022-09-21 DIAGNOSIS — G4733 Obstructive sleep apnea (adult) (pediatric): Secondary | ICD-10-CM | POA: Diagnosis not present

## 2022-09-28 DIAGNOSIS — M79671 Pain in right foot: Secondary | ICD-10-CM | POA: Diagnosis not present

## 2022-09-28 DIAGNOSIS — M722 Plantar fascial fibromatosis: Secondary | ICD-10-CM | POA: Diagnosis not present

## 2022-09-28 DIAGNOSIS — M79672 Pain in left foot: Secondary | ICD-10-CM | POA: Diagnosis not present

## 2022-10-22 DIAGNOSIS — G4733 Obstructive sleep apnea (adult) (pediatric): Secondary | ICD-10-CM | POA: Diagnosis not present

## 2022-12-07 ENCOUNTER — Ambulatory Visit: Payer: BC Managed Care – PPO | Admitting: Family Medicine

## 2022-12-15 ENCOUNTER — Ambulatory Visit: Payer: BC Managed Care – PPO | Admitting: Family Medicine

## 2022-12-15 VITALS — BP 132/84 | HR 85 | Temp 98.3°F | Ht 72.5 in | Wt 206.4 lb

## 2022-12-15 DIAGNOSIS — Z8616 Personal history of COVID-19: Secondary | ICD-10-CM

## 2022-12-15 DIAGNOSIS — Z79890 Hormone replacement therapy (postmenopausal): Secondary | ICD-10-CM | POA: Diagnosis not present

## 2022-12-15 DIAGNOSIS — E291 Testicular hypofunction: Secondary | ICD-10-CM | POA: Diagnosis not present

## 2022-12-15 DIAGNOSIS — Z7689 Persons encountering health services in other specified circumstances: Secondary | ICD-10-CM

## 2022-12-15 NOTE — Progress Notes (Signed)
New Patient Office Visit  Subjective    Patient ID: James Ballard, male    DOB: 1965/04/28  Age: 57 y.o. MRN: [id number]  CC:  Chief Complaint  Patient presents with   Establish Care    HPI James Ballard presents to establish care with new provider.   Patients previous primary care provider was Dr. Evelena Ballard within [facility]. Last seen: 11/08/2017.   Specialist: Endocrinology-Dr. Kizzie Ballard in [facility] with Dr. Sheran Ballard, III in [place], [place].  Dermatology Specialist in [place]   He is having surgery on left foot with Dr. Ollen Ballard, 10/24 in [place], [place].  He reports he has labs completed by James Ballard, endocrinology, in [place]. He reports his last visit was this morning. She is managing his prescriptions.   He reports he had covid last week with symptoms resolving.    Outpatient Encounter Medications as of 12/15/2022  Medication Sig   Cholecalciferol (VITAMIN D) 125 MCG (5000 UT) CAPS Take 1 capsule by mouth daily.   metFORMIN (GLUCOPHAGE-XR) 500 MG 24 hr tablet Take 500 mg by mouth daily.   omeprazole (PRILOSEC) 10 MG capsule Take 10 mg by mouth daily.   PRESCRIPTION MEDICATION Testosterone Pellet   ZOLMitriptan (ZOMIG PO) Take by mouth as needed.   [DISCONTINUED] diclofenac (FLECTOR) 1.3 % PTCH Apply topically.   [DISCONTINUED] diclofenac (VOLTAREN) 75 MG EC tablet Take 1 tablet (75 mg total) by mouth 2 (two) times daily. (Patient not taking: Reported on 12/29/2020)   [DISCONTINUED] esomeprazole (NEXIUM) 20 MG capsule  (Patient not taking: Reported on 12/29/2020)   [DISCONTINUED] fluorouracil (EFUDEX) 5 % cream Apply to face nightly x 2 weeks (wash off in the morning and sun protect)   [DISCONTINUED] meclizine (ANTIVERT) 25 MG tablet Take 25 mg by mouth daily as needed. (Patient not taking: Reported on 12/29/2020)   [DISCONTINUED] meloxicam (MOBIC) 15 MG tablet TAKE 1 TABLET(15 MG) BY MOUTH DAILY    [DISCONTINUED] methylPREDNISolone (MEDROL DOSEPAK) 4 MG TBPK tablet 6 day dose pack - take as directed   [DISCONTINUED] betamethasone acetate-betamethasone sodium phosphate (CELESTONE) injection 3 mg    [DISCONTINUED] betamethasone acetate-betamethasone sodium phosphate (CELESTONE) injection 3 mg    No facility-administered encounter medications on file as of 12/15/2022.    Past Medical History:  Diagnosis Date   GERD (gastroesophageal reflux disease) 15 years   Hyperlipidemia    Prediabetes    Sleep apnea 2022    Past Surgical History:  Procedure Laterality Date   ADENOIDECTOMY     ELBOW SURGERY Right    HEMORROIDECTOMY     KNEE ARTHROSCOPY     left   MOUTH SURGERY     sesmoid fx- excised     ULNAR NERVE TRANSPOSITION     VASECTOMY     WISDOM TOOTH EXTRACTION      Family History  Problem Relation Age of Onset   Heart disease Father        pacemaker--has been removed-may have never needed it   Atrial fibrillation Father    Migraines Sister    Hyperlipidemia Brother    Migraines Brother    Alcoholism Brother    Other Son        Noo Syndrome    Social History   Socioeconomic History   Marital status: Married    Spouse name: Not on file   Number of children: 2   Years of education: Not on file   Highest education level: Bachelor's degree (e.g.,  BA, AB, BS)  Occupational History   Not on file  Tobacco Use   Smoking status: Never   Smokeless tobacco: Never  Vaping Use   Vaping status: Never Used  Substance and Sexual Activity   Alcohol use: Yes    Alcohol/week: 1.0 standard drink of alcohol    Types: 1 Standard drinks or equivalent per week    Comment: Maybe one drink a month   Drug use: Never   Sexual activity: Yes    Birth control/protection: Surgical  Other Topics Concern   Not on file  Social History Narrative   Not on file   Social Determinants of Health   Financial Resource Strain: Low Risk  (12/15/2022)   Overall Financial Resource Strain  (CARDIA)    Difficulty of Paying Living Expenses: Not hard at all  Food Insecurity: No Food Insecurity (12/15/2022)   Hunger Vital Sign    Worried About Running Out of Food in the Last Year: Never true    Ran Out of Food in the Last Year: Never true  Transportation Needs: No Transportation Needs (12/15/2022)   PRAPARE - Administrator, Civil Service (Medical): No    Lack of Transportation (Non-Medical): No  Physical Activity: Insufficiently Active (12/15/2022)   Exercise Vital Sign    Days of Exercise per Week: 2 days    Minutes of Exercise per Session: 30 min  Stress: No Stress Concern Present (12/15/2022)   [employer] of Occupational Health - Occupational Stress Questionnaire    Feeling of Stress : Only a little  Social Connections: Socially Integrated (12/15/2022)   Social Connection and Isolation Panel [NHANES]    Frequency of Communication with Friends and Family: More than three times a week    Frequency of Social Gatherings with Friends and Family: Once a week    Attends Religious Services: More than 4 times per year    Active Member of [employer] or Organizations: Yes    Attends Banker Meetings: 1 to 4 times per year    Marital Status: Married  Catering manager Violence: Not At Risk (12/15/2022)   Humiliation, Afraid, Rape, and Kick questionnaire    Fear of Current or Ex-Partner: No    Emotionally Abused: No    Physically Abused: No    Sexually Abused: No    ROS See HPI above    Objective   BP 132/84 (BP Location: Left Arm, Patient Position: Sitting, Cuff Size: Large)   Pulse 85   Temp 98.3 F (36.8 C) (Oral)   Ht 6' 0.5" (1.842 m)   Wt 206 lb 6.4 oz (93.6 kg)   SpO2 96%   BMI 27.61 kg/m   Physical Exam Vitals reviewed.  Constitutional:      General: He is not in acute distress.    Appearance: Normal appearance. He is not ill-appearing, toxic-appearing or diaphoretic.  HENT:     Head: Normocephalic and atraumatic.  Eyes:      General:        Right eye: No discharge.        Left eye: No discharge.     Conjunctiva/sclera: Conjunctivae normal.  Cardiovascular:     Rate and Rhythm: Normal rate and regular rhythm.     Heart sounds: Normal heart sounds. No murmur heard.    No friction rub. No gallop.  Pulmonary:     Effort: Pulmonary effort is normal. No respiratory distress.     Breath sounds: Normal breath sounds.  Musculoskeletal:  General: Normal range of motion.  Skin:    General: Skin is warm and dry.  Neurological:     General: No focal deficit present.     Mental Status: He is alert and oriented to person, place, and time. Mental status is at baseline.  Psychiatric:        Mood and Affect: Mood normal.        Behavior: Behavior normal.        Thought Content: Thought content normal.        Judgment: Judgment normal.      Assessment & Plan:  There are no diagnoses linked to this encounter. 1.Review of health maintenance: -Covid booster: Declines  -Tdap vaccine: Had done in the 10 years ago  -Hep C and HIV screening: Declines  -Influenza vaccine: Will hold for today since he just had covid last week -Zoster vaccine: Had both vaccines, first one was 11/08/2017, second [facility] on [address].  2.Will obtain records for lab results and additional notes from other specialist.  3.Follow up in 8 weeks for a physical.  Return in about 8 weeks (around 02/09/2023) for physical.   Zandra Abts, NP

## 2022-12-15 NOTE — Patient Instructions (Addendum)
-  It was a nice to meet you today and look forward to taking care of you.  -Recommend to get influenza vaccine at least 2 from having covid. You may obtain at your physical appointment or make a nurse visit for the vaccine.  -Follow up in 8 weeks for physical. -I hope your upcoming surgery goes well.

## 2022-12-27 DIAGNOSIS — M25542 Pain in joints of left hand: Secondary | ICD-10-CM | POA: Diagnosis not present

## 2022-12-29 DIAGNOSIS — M79671 Pain in right foot: Secondary | ICD-10-CM | POA: Diagnosis not present

## 2022-12-29 DIAGNOSIS — M722 Plantar fascial fibromatosis: Secondary | ICD-10-CM | POA: Diagnosis not present

## 2023-01-19 DIAGNOSIS — G4733 Obstructive sleep apnea (adult) (pediatric): Secondary | ICD-10-CM | POA: Diagnosis not present

## 2023-01-19 DIAGNOSIS — M25542 Pain in joints of left hand: Secondary | ICD-10-CM | POA: Diagnosis not present

## 2023-02-01 DIAGNOSIS — Z79890 Hormone replacement therapy (postmenopausal): Secondary | ICD-10-CM | POA: Diagnosis not present

## 2023-02-01 DIAGNOSIS — E291 Testicular hypofunction: Secondary | ICD-10-CM | POA: Diagnosis not present

## 2023-02-07 DIAGNOSIS — M722 Plantar fascial fibromatosis: Secondary | ICD-10-CM | POA: Diagnosis not present

## 2023-02-07 DIAGNOSIS — M79671 Pain in right foot: Secondary | ICD-10-CM | POA: Diagnosis not present

## 2023-02-07 DIAGNOSIS — M79672 Pain in left foot: Secondary | ICD-10-CM | POA: Diagnosis not present

## 2023-02-08 ENCOUNTER — Encounter: Payer: BC Managed Care – PPO | Admitting: Family Medicine

## 2023-02-13 ENCOUNTER — Encounter: Payer: Self-pay | Admitting: Family Medicine

## 2023-02-13 ENCOUNTER — Ambulatory Visit (INDEPENDENT_AMBULATORY_CARE_PROVIDER_SITE_OTHER): Payer: BC Managed Care – PPO | Admitting: Family Medicine

## 2023-02-13 VITALS — BP 130/90 | HR 83 | Temp 98.2°F | Ht 72.25 in | Wt 208.0 lb

## 2023-02-13 DIAGNOSIS — Z0000 Encounter for general adult medical examination without abnormal findings: Secondary | ICD-10-CM

## 2023-02-13 DIAGNOSIS — Z23 Encounter for immunization: Secondary | ICD-10-CM | POA: Diagnosis not present

## 2023-02-13 MED ORDER — ZOLMITRIPTAN 5 MG PO TABS: 5.0000 mg | ORAL_TABLET | ORAL | 11 refills | Status: AC | PRN

## 2023-02-13 NOTE — Addendum Note (Signed)
Addended by: Carola Rhine on: 02/13/2023 04:48 PM   Modules accepted: Orders

## 2023-02-13 NOTE — Progress Notes (Signed)
Subjective:    Patient ID: James Ballard, male    DOB: [birth year], 57 y.o.   MRN: [id number]  HPI Here for a well exam (while his PCP, Zandra Abts NP is out of the office). He is doing fairly well other than dealing with chronic plantar fasciitis in the left foot. He recently had surgery on this foot per Dr. Terrill Mohr in [place], so it is still quite painful. He sees Dr. Kizzie Ide in [facility] for endocrinologic care. She implants testosterone pellets in his buttocks every 6 months. She does complete lab work every 3 months, he shows me the most recent results from last month. The PSA was 0.5, creatinine 1.23, GFR 69, LDL 129, and HDL 36. She also treats him for prediabetes, and his recent A1c was 6.0%. he has migraines about twice a month, and he gets prompt relief with Zomig.    Review of Systems  Constitutional: Negative.   HENT: Negative.    Eyes: Negative.   Respiratory: Negative.    Cardiovascular: Negative.   Gastrointestinal: Negative.   Genitourinary: Negative.   Musculoskeletal:  Positive for arthralgias.  Skin: Negative.   Neurological: Negative.   Psychiatric/Behavioral: Negative.         Objective:   Physical Exam Constitutional:      General: He is not in acute distress.    Appearance: Normal appearance. He is well-developed. He is not diaphoretic.  HENT:     Head: Normocephalic and atraumatic.     Right Ear: External ear normal.     Left Ear: External ear normal.     Nose: Nose normal.     Mouth/Throat:     Pharynx: No oropharyngeal exudate.  Eyes:     General: No scleral icterus.       Right eye: No discharge.        Left eye: No discharge.     Conjunctiva/sclera: Conjunctivae normal.     Pupils: Pupils are equal, round, and reactive to light.  Neck:     Thyroid: No thyromegaly.     Vascular: No JVD.     Trachea: No tracheal deviation.  Cardiovascular:     Rate and Rhythm: Normal rate and regular rhythm.     Pulses: Normal pulses.      Heart sounds: Normal heart sounds. No murmur heard.    No friction rub. No gallop.  Pulmonary:     Effort: Pulmonary effort is normal. No respiratory distress.     Breath sounds: Normal breath sounds. No wheezing or rales.  Chest:     Chest wall: No tenderness.  Abdominal:     General: Bowel sounds are normal. There is no distension.     Palpations: Abdomen is soft. There is no mass.     Tenderness: There is no abdominal tenderness. There is no guarding or rebound.  Genitourinary:    Penis: Normal. No tenderness.      Testes: Normal.     Prostate: Normal.     Rectum: Normal. Guaiac result negative.  Musculoskeletal:        General: No tenderness. Normal range of motion.     Cervical back: Neck supple.  Lymphadenopathy:     Cervical: No cervical adenopathy.  Skin:    General: Skin is warm and dry.     Coloration: Skin is not pale.     Findings: No erythema or rash.  Neurological:     General: No focal deficit present.     Mental Status:  He is alert and oriented to person, place, and time.     Cranial Nerves: No cranial nerve deficit.     Motor: No abnormal muscle tone.     Coordination: Coordination normal.     Deep Tendon Reflexes: Reflexes are normal and symmetric. Reflexes normal.  Psychiatric:        Mood and Affect: Mood normal.        Behavior: Behavior normal.        Thought Content: Thought content normal.        Judgment: Judgment normal.           Assessment & Plan:  Well exam. We discussed diet and exercise. I suggested he try swimming for exercise so he can be off his feet.  Gershon Crane, MD

## 2023-02-16 DIAGNOSIS — M25542 Pain in joints of left hand: Secondary | ICD-10-CM | POA: Diagnosis not present

## 2023-02-17 DIAGNOSIS — M722 Plantar fascial fibromatosis: Secondary | ICD-10-CM | POA: Diagnosis not present

## 2023-02-18 DIAGNOSIS — G4733 Obstructive sleep apnea (adult) (pediatric): Secondary | ICD-10-CM | POA: Diagnosis not present

## 2023-03-06 ENCOUNTER — Telehealth: Payer: Self-pay | Admitting: Internal Medicine

## 2023-03-06 NOTE — Telephone Encounter (Signed)
Error

## 2023-03-14 DIAGNOSIS — M722 Plantar fascial fibromatosis: Secondary | ICD-10-CM | POA: Diagnosis not present

## 2023-03-14 DIAGNOSIS — M79672 Pain in left foot: Secondary | ICD-10-CM | POA: Diagnosis not present

## 2023-03-21 DIAGNOSIS — G4733 Obstructive sleep apnea (adult) (pediatric): Secondary | ICD-10-CM | POA: Diagnosis not present

## 2023-03-23 DIAGNOSIS — L309 Dermatitis, unspecified: Secondary | ICD-10-CM | POA: Diagnosis not present

## 2023-03-30 DIAGNOSIS — D4819 Other specified neoplasm of uncertain behavior of connective and other soft tissue: Secondary | ICD-10-CM | POA: Diagnosis not present

## 2023-03-31 DIAGNOSIS — M722 Plantar fascial fibromatosis: Secondary | ICD-10-CM | POA: Diagnosis not present

## 2023-04-11 DIAGNOSIS — M25572 Pain in left ankle and joints of left foot: Secondary | ICD-10-CM | POA: Diagnosis not present

## 2023-04-14 DIAGNOSIS — M722 Plantar fascial fibromatosis: Secondary | ICD-10-CM | POA: Diagnosis not present

## 2023-05-09 DIAGNOSIS — M722 Plantar fascial fibromatosis: Secondary | ICD-10-CM | POA: Diagnosis not present

## 2023-05-09 DIAGNOSIS — D7589 Other specified diseases of blood and blood-forming organs: Secondary | ICD-10-CM | POA: Diagnosis not present

## 2023-05-09 DIAGNOSIS — G5752 Tarsal tunnel syndrome, left lower limb: Secondary | ICD-10-CM | POA: Diagnosis not present

## 2023-05-09 DIAGNOSIS — M898X7 Other specified disorders of bone, ankle and foot: Secondary | ICD-10-CM | POA: Diagnosis not present

## 2023-05-24 ENCOUNTER — Encounter: Payer: BC Managed Care – PPO | Admitting: Family Medicine

## 2023-05-25 DIAGNOSIS — D4819 Other specified neoplasm of uncertain behavior of connective and other soft tissue: Secondary | ICD-10-CM | POA: Diagnosis not present

## 2023-05-26 DIAGNOSIS — R262 Difficulty in walking, not elsewhere classified: Secondary | ICD-10-CM | POA: Diagnosis not present

## 2023-05-26 DIAGNOSIS — M25672 Stiffness of left ankle, not elsewhere classified: Secondary | ICD-10-CM | POA: Diagnosis not present

## 2023-05-26 DIAGNOSIS — R600 Localized edema: Secondary | ICD-10-CM | POA: Diagnosis not present

## 2023-05-26 DIAGNOSIS — M79672 Pain in left foot: Secondary | ICD-10-CM | POA: Diagnosis not present

## 2023-05-30 DIAGNOSIS — M79672 Pain in left foot: Secondary | ICD-10-CM | POA: Diagnosis not present

## 2023-05-30 DIAGNOSIS — R262 Difficulty in walking, not elsewhere classified: Secondary | ICD-10-CM | POA: Diagnosis not present

## 2023-05-30 DIAGNOSIS — M25672 Stiffness of left ankle, not elsewhere classified: Secondary | ICD-10-CM | POA: Diagnosis not present

## 2023-05-30 DIAGNOSIS — R600 Localized edema: Secondary | ICD-10-CM | POA: Diagnosis not present

## 2023-06-02 DIAGNOSIS — M25672 Stiffness of left ankle, not elsewhere classified: Secondary | ICD-10-CM | POA: Diagnosis not present

## 2023-06-02 DIAGNOSIS — R262 Difficulty in walking, not elsewhere classified: Secondary | ICD-10-CM | POA: Diagnosis not present

## 2023-06-02 DIAGNOSIS — M79672 Pain in left foot: Secondary | ICD-10-CM | POA: Diagnosis not present

## 2023-06-02 DIAGNOSIS — R600 Localized edema: Secondary | ICD-10-CM | POA: Diagnosis not present

## 2023-06-13 DIAGNOSIS — M25672 Stiffness of left ankle, not elsewhere classified: Secondary | ICD-10-CM | POA: Diagnosis not present

## 2023-06-13 DIAGNOSIS — R600 Localized edema: Secondary | ICD-10-CM | POA: Diagnosis not present

## 2023-06-13 DIAGNOSIS — M79672 Pain in left foot: Secondary | ICD-10-CM | POA: Diagnosis not present

## 2023-06-13 DIAGNOSIS — R262 Difficulty in walking, not elsewhere classified: Secondary | ICD-10-CM | POA: Diagnosis not present

## 2023-06-16 DIAGNOSIS — R262 Difficulty in walking, not elsewhere classified: Secondary | ICD-10-CM | POA: Diagnosis not present

## 2023-06-16 DIAGNOSIS — M79672 Pain in left foot: Secondary | ICD-10-CM | POA: Diagnosis not present

## 2023-06-16 DIAGNOSIS — R600 Localized edema: Secondary | ICD-10-CM | POA: Diagnosis not present

## 2023-06-16 DIAGNOSIS — M25672 Stiffness of left ankle, not elsewhere classified: Secondary | ICD-10-CM | POA: Diagnosis not present

## 2023-06-28 DIAGNOSIS — M25672 Stiffness of left ankle, not elsewhere classified: Secondary | ICD-10-CM | POA: Diagnosis not present

## 2023-06-28 DIAGNOSIS — R262 Difficulty in walking, not elsewhere classified: Secondary | ICD-10-CM | POA: Diagnosis not present

## 2023-06-28 DIAGNOSIS — M79672 Pain in left foot: Secondary | ICD-10-CM | POA: Diagnosis not present

## 2023-06-28 DIAGNOSIS — R600 Localized edema: Secondary | ICD-10-CM | POA: Diagnosis not present

## 2023-06-30 DIAGNOSIS — M79672 Pain in left foot: Secondary | ICD-10-CM | POA: Diagnosis not present

## 2023-06-30 DIAGNOSIS — R262 Difficulty in walking, not elsewhere classified: Secondary | ICD-10-CM | POA: Diagnosis not present

## 2023-06-30 DIAGNOSIS — R600 Localized edema: Secondary | ICD-10-CM | POA: Diagnosis not present

## 2023-06-30 DIAGNOSIS — M25672 Stiffness of left ankle, not elsewhere classified: Secondary | ICD-10-CM | POA: Diagnosis not present

## 2023-07-04 DIAGNOSIS — M25672 Stiffness of left ankle, not elsewhere classified: Secondary | ICD-10-CM | POA: Diagnosis not present

## 2023-07-04 DIAGNOSIS — R600 Localized edema: Secondary | ICD-10-CM | POA: Diagnosis not present

## 2023-07-04 DIAGNOSIS — R262 Difficulty in walking, not elsewhere classified: Secondary | ICD-10-CM | POA: Diagnosis not present

## 2023-07-04 DIAGNOSIS — M79672 Pain in left foot: Secondary | ICD-10-CM | POA: Diagnosis not present

## 2023-07-07 DIAGNOSIS — R600 Localized edema: Secondary | ICD-10-CM | POA: Diagnosis not present

## 2023-07-07 DIAGNOSIS — R262 Difficulty in walking, not elsewhere classified: Secondary | ICD-10-CM | POA: Diagnosis not present

## 2023-07-07 DIAGNOSIS — M79672 Pain in left foot: Secondary | ICD-10-CM | POA: Diagnosis not present

## 2023-07-07 DIAGNOSIS — M25672 Stiffness of left ankle, not elsewhere classified: Secondary | ICD-10-CM | POA: Diagnosis not present

## 2023-07-11 DIAGNOSIS — L578 Other skin changes due to chronic exposure to nonionizing radiation: Secondary | ICD-10-CM | POA: Diagnosis not present

## 2023-07-11 DIAGNOSIS — L814 Other melanin hyperpigmentation: Secondary | ICD-10-CM | POA: Diagnosis not present

## 2023-07-11 DIAGNOSIS — L821 Other seborrheic keratosis: Secondary | ICD-10-CM | POA: Diagnosis not present

## 2023-07-11 DIAGNOSIS — D229 Melanocytic nevi, unspecified: Secondary | ICD-10-CM | POA: Diagnosis not present

## 2023-07-14 DIAGNOSIS — R262 Difficulty in walking, not elsewhere classified: Secondary | ICD-10-CM | POA: Diagnosis not present

## 2023-07-14 DIAGNOSIS — M79672 Pain in left foot: Secondary | ICD-10-CM | POA: Diagnosis not present

## 2023-07-14 DIAGNOSIS — M25672 Stiffness of left ankle, not elsewhere classified: Secondary | ICD-10-CM | POA: Diagnosis not present

## 2023-07-14 DIAGNOSIS — R600 Localized edema: Secondary | ICD-10-CM | POA: Diagnosis not present

## 2023-07-18 DIAGNOSIS — R600 Localized edema: Secondary | ICD-10-CM | POA: Diagnosis not present

## 2023-07-18 DIAGNOSIS — R262 Difficulty in walking, not elsewhere classified: Secondary | ICD-10-CM | POA: Diagnosis not present

## 2023-07-18 DIAGNOSIS — M79672 Pain in left foot: Secondary | ICD-10-CM | POA: Diagnosis not present

## 2023-07-18 DIAGNOSIS — M25672 Stiffness of left ankle, not elsewhere classified: Secondary | ICD-10-CM | POA: Diagnosis not present

## 2023-07-21 DIAGNOSIS — R262 Difficulty in walking, not elsewhere classified: Secondary | ICD-10-CM | POA: Diagnosis not present

## 2023-07-21 DIAGNOSIS — M25672 Stiffness of left ankle, not elsewhere classified: Secondary | ICD-10-CM | POA: Diagnosis not present

## 2023-07-21 DIAGNOSIS — R600 Localized edema: Secondary | ICD-10-CM | POA: Diagnosis not present

## 2023-07-21 DIAGNOSIS — M79672 Pain in left foot: Secondary | ICD-10-CM | POA: Diagnosis not present

## 2023-07-22 DIAGNOSIS — G4733 Obstructive sleep apnea (adult) (pediatric): Secondary | ICD-10-CM | POA: Diagnosis not present

## 2023-07-25 DIAGNOSIS — M79672 Pain in left foot: Secondary | ICD-10-CM | POA: Diagnosis not present

## 2023-07-25 DIAGNOSIS — M25672 Stiffness of left ankle, not elsewhere classified: Secondary | ICD-10-CM | POA: Diagnosis not present

## 2023-07-25 DIAGNOSIS — R262 Difficulty in walking, not elsewhere classified: Secondary | ICD-10-CM | POA: Diagnosis not present

## 2023-07-25 DIAGNOSIS — R600 Localized edema: Secondary | ICD-10-CM | POA: Diagnosis not present

## 2023-07-27 DIAGNOSIS — E291 Testicular hypofunction: Secondary | ICD-10-CM | POA: Diagnosis not present

## 2023-07-27 DIAGNOSIS — Z79890 Hormone replacement therapy (postmenopausal): Secondary | ICD-10-CM | POA: Diagnosis not present

## 2023-07-28 DIAGNOSIS — R262 Difficulty in walking, not elsewhere classified: Secondary | ICD-10-CM | POA: Diagnosis not present

## 2023-07-28 DIAGNOSIS — M79672 Pain in left foot: Secondary | ICD-10-CM | POA: Diagnosis not present

## 2023-07-28 DIAGNOSIS — R600 Localized edema: Secondary | ICD-10-CM | POA: Diagnosis not present

## 2023-07-28 DIAGNOSIS — M25672 Stiffness of left ankle, not elsewhere classified: Secondary | ICD-10-CM | POA: Diagnosis not present

## 2023-08-01 DIAGNOSIS — M25672 Stiffness of left ankle, not elsewhere classified: Secondary | ICD-10-CM | POA: Diagnosis not present

## 2023-08-01 DIAGNOSIS — R600 Localized edema: Secondary | ICD-10-CM | POA: Diagnosis not present

## 2023-08-01 DIAGNOSIS — R262 Difficulty in walking, not elsewhere classified: Secondary | ICD-10-CM | POA: Diagnosis not present

## 2023-08-01 DIAGNOSIS — M79672 Pain in left foot: Secondary | ICD-10-CM | POA: Diagnosis not present

## 2023-08-04 DIAGNOSIS — R262 Difficulty in walking, not elsewhere classified: Secondary | ICD-10-CM | POA: Diagnosis not present

## 2023-08-04 DIAGNOSIS — M79672 Pain in left foot: Secondary | ICD-10-CM | POA: Diagnosis not present

## 2023-08-04 DIAGNOSIS — M25672 Stiffness of left ankle, not elsewhere classified: Secondary | ICD-10-CM | POA: Diagnosis not present

## 2023-08-04 DIAGNOSIS — R600 Localized edema: Secondary | ICD-10-CM | POA: Diagnosis not present

## 2023-08-07 DIAGNOSIS — M79672 Pain in left foot: Secondary | ICD-10-CM | POA: Diagnosis not present

## 2023-08-07 DIAGNOSIS — R262 Difficulty in walking, not elsewhere classified: Secondary | ICD-10-CM | POA: Diagnosis not present

## 2023-08-07 DIAGNOSIS — R600 Localized edema: Secondary | ICD-10-CM | POA: Diagnosis not present

## 2023-08-07 DIAGNOSIS — M25672 Stiffness of left ankle, not elsewhere classified: Secondary | ICD-10-CM | POA: Diagnosis not present

## 2023-08-08 DIAGNOSIS — M79672 Pain in left foot: Secondary | ICD-10-CM | POA: Diagnosis not present

## 2023-08-22 DIAGNOSIS — G4733 Obstructive sleep apnea (adult) (pediatric): Secondary | ICD-10-CM | POA: Diagnosis not present

## 2023-09-04 DIAGNOSIS — M79672 Pain in left foot: Secondary | ICD-10-CM | POA: Diagnosis not present

## 2023-10-01 DIAGNOSIS — G4733 Obstructive sleep apnea (adult) (pediatric): Secondary | ICD-10-CM | POA: Diagnosis not present

## 2023-11-01 DIAGNOSIS — G4733 Obstructive sleep apnea (adult) (pediatric): Secondary | ICD-10-CM | POA: Diagnosis not present

## 2023-12-02 DIAGNOSIS — G4733 Obstructive sleep apnea (adult) (pediatric): Secondary | ICD-10-CM | POA: Diagnosis not present

## 2024-01-15 DIAGNOSIS — Z79890 Hormone replacement therapy (postmenopausal): Secondary | ICD-10-CM | POA: Diagnosis not present

## 2024-01-15 DIAGNOSIS — E291 Testicular hypofunction: Secondary | ICD-10-CM | POA: Diagnosis not present

## 2024-02-06 DIAGNOSIS — G4733 Obstructive sleep apnea (adult) (pediatric): Secondary | ICD-10-CM | POA: Diagnosis not present
